# Patient Record
Sex: Female | Born: 1960 | Hispanic: Yes | Smoking: Never smoker
Health system: Southern US, Community
[De-identification: ages and names within clinical notes are randomized; demographics above are authoritative.]

## PROBLEM LIST (undated history)

## (undated) DIAGNOSIS — M8580 Other specified disorders of bone density and structure, unspecified site: Secondary | ICD-10-CM

## (undated) DIAGNOSIS — K219 Gastro-esophageal reflux disease without esophagitis: Secondary | ICD-10-CM

## (undated) HISTORY — PX: MYOMECTOMY: SHX85

## (undated) HISTORY — DX: Other specified disorders of bone density and structure, unspecified site: M85.80

## (undated) HISTORY — DX: Gastro-esophageal reflux disease without esophagitis: K21.9

---

## 2005-10-22 HISTORY — PX: MYOMECTOMY: SHX85

## 2018-08-17 DIAGNOSIS — K047 Periapical abscess without sinus: Secondary | ICD-10-CM | POA: Diagnosis not present

## 2018-08-17 DIAGNOSIS — R6884 Jaw pain: Secondary | ICD-10-CM | POA: Diagnosis not present

## 2018-10-06 ENCOUNTER — Ambulatory Visit (INDEPENDENT_AMBULATORY_CARE_PROVIDER_SITE_OTHER): Payer: BLUE CROSS/BLUE SHIELD | Admitting: Family Medicine

## 2018-10-06 ENCOUNTER — Encounter: Payer: Self-pay | Admitting: Family Medicine

## 2018-10-06 VITALS — BP 134/82 | HR 82 | Temp 98.6°F | Ht 61.0 in | Wt 135.6 lb

## 2018-10-06 DIAGNOSIS — Z1159 Encounter for screening for other viral diseases: Secondary | ICD-10-CM | POA: Diagnosis not present

## 2018-10-06 DIAGNOSIS — M25561 Pain in right knee: Secondary | ICD-10-CM

## 2018-10-06 DIAGNOSIS — M722 Plantar fascial fibromatosis: Secondary | ICD-10-CM

## 2018-10-06 DIAGNOSIS — R030 Elevated blood-pressure reading, without diagnosis of hypertension: Secondary | ICD-10-CM

## 2018-10-06 DIAGNOSIS — R140 Abdominal distension (gaseous): Secondary | ICD-10-CM | POA: Diagnosis not present

## 2018-10-06 DIAGNOSIS — Z114 Encounter for screening for human immunodeficiency virus [HIV]: Secondary | ICD-10-CM | POA: Diagnosis not present

## 2018-10-06 DIAGNOSIS — Z23 Encounter for immunization: Secondary | ICD-10-CM

## 2018-10-06 DIAGNOSIS — M25562 Pain in left knee: Secondary | ICD-10-CM

## 2018-10-06 DIAGNOSIS — Z131 Encounter for screening for diabetes mellitus: Secondary | ICD-10-CM | POA: Diagnosis not present

## 2018-10-06 DIAGNOSIS — Z13220 Encounter for screening for lipoid disorders: Secondary | ICD-10-CM | POA: Diagnosis not present

## 2018-10-06 DIAGNOSIS — L720 Epidermal cyst: Secondary | ICD-10-CM

## 2018-10-06 DIAGNOSIS — G8929 Other chronic pain: Secondary | ICD-10-CM | POA: Diagnosis not present

## 2018-10-06 LAB — CBC
HCT: 41 % (ref 36.0–46.0)
Hemoglobin: 14 g/dL (ref 12.0–15.0)
MCHC: 34.1 g/dL (ref 30.0–36.0)
MCV: 92.9 fl (ref 78.0–100.0)
Platelets: 315 10*3/uL (ref 150.0–400.0)
RBC: 4.41 Mil/uL (ref 3.87–5.11)
RDW: 13.5 % (ref 11.5–15.5)
WBC: 5.2 10*3/uL (ref 4.0–10.5)

## 2018-10-06 LAB — COMPREHENSIVE METABOLIC PANEL
ALT: 33 U/L (ref 0–35)
AST: 21 U/L (ref 0–37)
Albumin: 4.3 g/dL (ref 3.5–5.2)
Alkaline Phosphatase: 63 U/L (ref 39–117)
BUN: 13 mg/dL (ref 6–23)
CHLORIDE: 103 meq/L (ref 96–112)
CO2: 29 mEq/L (ref 19–32)
CREATININE: 0.64 mg/dL (ref 0.40–1.20)
Calcium: 9.7 mg/dL (ref 8.4–10.5)
GFR: 101.64 mL/min (ref >60.00)
Glucose, Bld: 99 mg/dL (ref 70–99)
Potassium: 3.8 mEq/L (ref 3.5–5.1)
Sodium: 140 mEq/L (ref 135–145)
Total Bilirubin: 0.7 mg/dL (ref 0.2–1.2)
Total Protein: 7.3 g/dL (ref 6.0–8.3)

## 2018-10-06 LAB — LIPID PANEL
Cholesterol: 249 mg/dL — ABNORMAL HIGH (ref 0–200)
HDL: 57.7 mg/dL (ref >39.00)
LDL Cholesterol: 152 mg/dL — ABNORMAL HIGH (ref 0–99)
NonHDL: 191.1
Total CHOL/HDL Ratio: 4
Triglycerides: 194 mg/dL — ABNORMAL HIGH (ref 0.0–149.0)
VLDL: 38.8 mg/dL (ref 0.0–40.0)

## 2018-10-06 LAB — TSH: TSH: 3.52 u[IU]/mL (ref 0.35–4.50)

## 2018-10-06 LAB — HEMOGLOBIN A1C: Hgb A1c MFr Bld: 5.8 % (ref 4.6–6.5)

## 2018-10-06 MED ORDER — SIMETHICONE 80 MG PO CHEW: 80.0000 mg | CHEWABLE_TABLET | Freq: Four times a day (QID) | ORAL | 0 refills | Status: DC | PRN

## 2018-10-06 NOTE — Assessment & Plan Note (Signed)
No red flags.  Benign abdominal exam.  I am not sure what medication she was taking while improved.  Symptoms seem to be most related to gas/bloating.  We will start trial of simethicone.

## 2018-10-06 NOTE — Progress Notes (Signed)
Subjective:  Brandy [name] is a 57 y.o. female who presents today with a chief complaint of elevated blood pressure reading and to establish care.   HPI:  Elevated blood pressure reading Patient has never had elevated blood pressure readings in the past.  She recently had a dental procedure done and was told that she had high blood pressure readings.  She does not remember the the numbers.  She has never been on medications in the past.  Recently moved to [place] and has been under some stress recently.  No chest pain or shortness of breath.  Bilateral knee pain Chronic problem.  New to provider.  Several year history.  Symptoms are stable.  Pain is worse with activity and exercise.  She reportedly had injections done in the past which did not significantly help.  Pain is currently manageable.  She does not take any medication regularly for this.  No history of trauma or other obvious precipitating events.  No locking, catching, or buckling.  Skin lesions Chronic problem.  New to provider.  Several year history.  Patient has several scattered, cystic lesions across her face.  She previously saw a dermatologist while in [place] and had a procedure done to remove the lesions.  Her symptoms are stable.  She would like to be referred back to dermatology to have this evaluated.  Abdominal bloating Chronic problem.  New to provider.  Several year history.  Patient was previously on a medication twice daily to help with her symptoms.  She does not member the name of this medication.  Since moving to the [place] about a month ago, she has not been on any medication and she has had a worsening of her symptoms.  She denies any pain.  Typically has bloating after eating.  She does not have any other clear or obvious precipitating or aggravating factors.  No nausea or vomiting.  No constipation or diarrhea.  No melena.  No hematochezia.  Foot pain Chronic problem.  New to provider.   Worsened recently.  Located in the bottom of both of her feet.  Worse with first step in the morning.  No treatments tried for this.  ROS: Per HPI, otherwise a complete review of systems was negative.   PMH:  The following were reviewed and entered/updated in epic: History reviewed. No pertinent past medical history. Patient Active Problem List   Diagnosis Date Noted  . Epidermal inclusion cyst 10/06/2018  . Elevated blood pressure reading 10/06/2018  . Chronic pain of both knees 10/06/2018  . Plantar fasciitis 10/06/2018  . Abdominal bloating 10/06/2018   Past Surgical History:  Procedure Laterality Date  . MYOMECTOMY     2007    History reviewed. No pertinent family history.  Medications- reviewed and updated Current Outpatient Medications  Medication Sig Dispense Refill  . Omega-3 Fatty Acids (FISH OIL) 1000 MG CAPS Take by mouth.    Marland Kitchen. VITAMIN D, CHOLECALCIFEROL, PO Take by mouth.    . simethicone (MYLICON) 80 MG chewable tablet Chew 1 tablet (80 mg total) by mouth every 6 (six) hours as needed for flatulence. 30 tablet 0   No current facility-administered medications for this visit.     Allergies-reviewed and updated No Known Allergies  Social History   Socioeconomic History  . Marital status: Not on file    Spouse name: Not on file  . Number of children: Not on file  . Years of education: Not on file  . Highest education  level: Not on file  Occupational History  . Not on file  Social Needs  . Financial resource strain: Not on file  . Food insecurity:    Worry: Not on file    Inability: Not on file  . Transportation needs:    Medical: Not on file    Non-medical: Not on file  Tobacco Use  . Smoking status: Never Smoker  . Smokeless tobacco: Never Used  Substance and Sexual Activity  . Alcohol use: Not Currently  . Drug use: Never  . Sexual activity: Not on file  Lifestyle  . Physical activity:    Days per week: Not on file    Minutes per session:  Not on file  . Stress: Not on file  Relationships  . Social connections:    Talks on phone: Not on file    Gets together: Not on file    Attends religious service: Not on file    Active member of club or organization: Not on file    Attends meetings of clubs or organizations: Not on file    Relationship status: Not on file  Other Topics Concern  . Not on file  Social History Narrative  . Not on file     Objective:  Physical Exam: BP 134/82 (BP Location: Left Arm, Patient Position: Sitting, Cuff Size: Normal)   Pulse 82   Temp 98.6 F (37 C) (Oral)   Ht 5\' 1"  (1.549 m)   Wt 135 lb 9.6 oz (61.5 kg)   SpO2 97%   BMI 25.62 kg/m   Gen: NAD, resting comfortably CV: RRR with no murmurs appreciated Pulm: NWOB, CTAB with no crackles, wheezes, or rhonchi GI: Normal bowel sounds present. Soft, Nontender, Nondistended. MSK:  -Knees: No deformities.  Crepitus with passive range of motion bilaterally.  No joint tenderness.  Stable to varus and valgus stress.  Anterior and posterior drawer signs negative.  McMurray negative bilaterally.  Neurovascular intact distally. Skin: Several small, discrete 1 to 2 mm in diameter cystic lesions scattered across face. Neuro: Grossly normal, moves all extremities Psych: Normal affect and thought content  Assessment/Plan:  Plantar fasciitis No red flags.  Recommended stretching before getting out of bed in the morning.  Also recommended use of frozen ice bottle and/or tennis ball.  Epidermal inclusion cyst Referral placed to dermatology.  Elevated blood pressure reading At goal.  Discussed home blood pressure monitoring with goal 140/90 or lower.  We will continue with watchful waiting.  Chronic pain of both knees Likely secondary to osteoarthritis.  No red flags.  Discussed conservative management including ice, compression, and Tylenol as needed.  Discussed reasons to return to care.  Abdominal bloating No red flags.  Benign abdominal exam.   I am not sure what medication she was taking while improved.  Symptoms seem to be most related to gas/bloating.  We will start trial of simethicone.  Preventative Healthcare Patient was instructed to return soon for CPE.  Flu shot given today.  Tdap given today.  Check lipid panel and A1c.  Will be due for screening mammogram, colonoscopy, and Pap smear soon-will attempt again prior records. Health Maintenance Due  Topic Date Due  . Hepatitis C Screening  August 06, 1961  . HIV Screening  09/14/1976  . TETANUS/TDAP  09/14/1980  . PAP SMEAR-Modifier  09/14/1982  . MAMMOGRAM  09/15/2011  . COLONOSCOPY  09/15/2011  . INFLUENZA VACCINE  05/22/2018   Katina Degree. [name], MD 10/06/2018 12:31 PM

## 2018-10-06 NOTE — Assessment & Plan Note (Signed)
No red flags.  Recommended stretching before getting out of bed in the morning.  Also recommended use of frozen ice bottle and/or tennis ball.

## 2018-10-06 NOTE — Addendum Note (Signed)
Addended by: Koleen DistanceAGNER, [place] Coffin M on: 10/06/2018 03:11 PM   Modules accepted: Orders

## 2018-10-06 NOTE — Assessment & Plan Note (Signed)
At goal.  Discussed home blood pressure monitoring with goal 140/90 or lower.  We will continue with watchful waiting.

## 2018-10-06 NOTE — Assessment & Plan Note (Signed)
Referral placed to dermatology

## 2018-10-06 NOTE — Patient Instructions (Addendum)
It was very nice to see you today!  We will send you to a dermatologist for the spots on your face.  Please try the simethicone for bloating.  Let me know if it is not working you can try another medication.  You have arthritis in your knees.  Please use ice to the area a few times a day.  You can also use compression sleeves to the area.  You can take Tylenol 1000 mg 3 times daily as needed as well.  You can also try using glucosamine-chondroitin.   You also have plantar fasciitis.  Please try stretching this area every day.  You can also use a frozen water bottle or tennis ball to the area.  We will give your flu vaccine today.  We will also give your tetanus shot.  We will check blood work today.  Take care, Dr Jimmey RalphParker

## 2018-10-06 NOTE — Assessment & Plan Note (Signed)
Likely secondary to osteoarthritis.  No red flags.  Discussed conservative management including ice, compression, and Tylenol as needed.  Discussed reasons to return to care.

## 2018-10-07 LAB — HEPATITIS C ANTIBODY
Hepatitis C Ab: NONREACTIVE
SIGNAL TO CUT-OFF: 0.11 (ref <1.00)

## 2018-10-07 LAB — HIV ANTIBODY (ROUTINE TESTING W REFLEX): HIV 1&2 Ab, 4th Generation: NONREACTIVE

## 2018-10-08 ENCOUNTER — Encounter: Payer: Self-pay | Admitting: Family Medicine

## 2018-10-08 DIAGNOSIS — E785 Hyperlipidemia, unspecified: Secondary | ICD-10-CM | POA: Insufficient documentation

## 2018-10-08 NOTE — Progress Notes (Signed)
Please inform patient of the following:  Her cholesterol and blood sugar levels are borderline. Do not need to start medications at this point, but would recommend continuing to work on diet and exercise and we can recheck again in 1 year.  All of her other blood work was normal.  Katina Degreealeb M. Jimmey RalphParker, MD 10/08/2018 7:53 AM

## 2018-11-19 DIAGNOSIS — L821 Other seborrheic keratosis: Secondary | ICD-10-CM | POA: Diagnosis not present

## 2018-11-19 DIAGNOSIS — L720 Epidermal cyst: Secondary | ICD-10-CM | POA: Diagnosis not present

## 2018-11-19 DIAGNOSIS — I781 Nevus, non-neoplastic: Secondary | ICD-10-CM | POA: Diagnosis not present

## 2018-11-19 DIAGNOSIS — L814 Other melanin hyperpigmentation: Secondary | ICD-10-CM | POA: Diagnosis not present

## 2019-07-13 ENCOUNTER — Ambulatory Visit (INDEPENDENT_AMBULATORY_CARE_PROVIDER_SITE_OTHER): Payer: BC Managed Care – PPO | Admitting: Family Medicine

## 2019-07-13 ENCOUNTER — Other Ambulatory Visit: Payer: Self-pay

## 2019-07-13 ENCOUNTER — Other Ambulatory Visit (HOSPITAL_COMMUNITY)
Admission: RE | Admit: 2019-07-13 | Discharge: 2019-07-13 | Disposition: A | Discharge status: Home / Self Care | Payer: BC Managed Care – PPO | Source: Ambulatory Visit | Attending: Family Medicine | Admitting: Family Medicine

## 2019-07-13 ENCOUNTER — Encounter: Payer: Self-pay | Admitting: Family Medicine

## 2019-07-13 VITALS — BP 116/64 | HR 63 | Temp 98.7°F | Ht 61.0 in | Wt 132.2 lb

## 2019-07-13 DIAGNOSIS — E785 Hyperlipidemia, unspecified: Secondary | ICD-10-CM | POA: Diagnosis not present

## 2019-07-13 DIAGNOSIS — R739 Hyperglycemia, unspecified: Secondary | ICD-10-CM | POA: Diagnosis not present

## 2019-07-13 DIAGNOSIS — Z124 Encounter for screening for malignant neoplasm of cervix: Secondary | ICD-10-CM | POA: Insufficient documentation

## 2019-07-13 DIAGNOSIS — Z23 Encounter for immunization: Secondary | ICD-10-CM | POA: Diagnosis not present

## 2019-07-13 DIAGNOSIS — Z1211 Encounter for screening for malignant neoplasm of colon: Secondary | ICD-10-CM

## 2019-07-13 DIAGNOSIS — E559 Vitamin D deficiency, unspecified: Secondary | ICD-10-CM | POA: Diagnosis not present

## 2019-07-13 DIAGNOSIS — Z0001 Encounter for general adult medical examination with abnormal findings: Secondary | ICD-10-CM

## 2019-07-13 DIAGNOSIS — M25521 Pain in right elbow: Secondary | ICD-10-CM

## 2019-07-13 LAB — COMPREHENSIVE METABOLIC PANEL
ALT: 18 U/L (ref 0–35)
AST: 18 U/L (ref 0–37)
Albumin: 4.3 g/dL (ref 3.5–5.2)
Alkaline Phosphatase: 74 U/L (ref 39–117)
BUN: 12 mg/dL (ref 6–23)
CO2: 28 mEq/L (ref 19–32)
Calcium: 9.5 mg/dL (ref 8.4–10.5)
Chloride: 103 mEq/L (ref 96–112)
Creatinine, Ser: 0.69 mg/dL (ref 0.40–1.20)
GFR: 87.44 mL/min (ref >60.00)
Glucose, Bld: 104 mg/dL — ABNORMAL HIGH (ref 70–99)
Potassium: 3.9 mEq/L (ref 3.5–5.1)
Sodium: 139 mEq/L (ref 135–145)
Total Bilirubin: 0.6 mg/dL (ref 0.2–1.2)
Total Protein: 7.3 g/dL (ref 6.0–8.3)

## 2019-07-13 LAB — LIPID PANEL
Cholesterol: 224 mg/dL — ABNORMAL HIGH (ref 0–200)
HDL: 51.4 mg/dL (ref >39.00)
LDL Cholesterol: 133 mg/dL — ABNORMAL HIGH (ref 0–99)
NonHDL: 172.96
Total CHOL/HDL Ratio: 4
Triglycerides: 199 mg/dL — ABNORMAL HIGH (ref 0.0–149.0)
VLDL: 39.8 mg/dL (ref 0.0–40.0)

## 2019-07-13 LAB — CBC
HCT: 43.5 % (ref 36.0–46.0)
Hemoglobin: 14.4 g/dL (ref 12.0–15.0)
MCHC: 33.2 g/dL (ref 30.0–36.0)
MCV: 94.1 fl (ref 78.0–100.0)
Platelets: 271 10*3/uL (ref 150.0–400.0)
RBC: 4.62 Mil/uL (ref 3.87–5.11)
RDW: 13.7 % (ref 11.5–15.5)
WBC: 4.5 10*3/uL (ref 4.0–10.5)

## 2019-07-13 LAB — HEMOGLOBIN A1C: Hgb A1c MFr Bld: 5.9 % (ref 4.6–6.5)

## 2019-07-13 LAB — VITAMIN D 25 HYDROXY (VIT D DEFICIENCY, FRACTURES): VITD: 21.68 ng/mL — ABNORMAL LOW (ref 30.00–100.00)

## 2019-07-13 LAB — VITAMIN B12: Vitamin B-12: 213 pg/mL (ref 211–911)

## 2019-07-13 LAB — TSH: TSH: 4.48 u[IU]/mL (ref 0.35–4.50)

## 2019-07-13 NOTE — Assessment & Plan Note (Signed)
Check A1c. 

## 2019-07-13 NOTE — Assessment & Plan Note (Signed)
Check vitamin D level 

## 2019-07-13 NOTE — Progress Notes (Signed)
Chief Complaint:  Brandy [name] is a 58 y.o. female who presents today for her annual comprehensive physical exam.    Assessment/Plan:  Hyperglycemia Check A1c.  Vitamin D deficiency Check vitamin D level.  Dyslipidemia Check CBC, C met, TSH, and lipid panel.  Right Elbow Pain Mild right lateral epicondylitis.  Recommended Voltaren. Discussed reasons to return to care.   Preventative Healthcare: Flu vaccine today. Order for cologuard placed. Pap smear performed today. Check CBC, CMET, TSH, A1c, and lipid panel.   Patient Counseling(The following topics were reviewed and/or handout was given):  -Nutrition: Stressed importance of moderation in sodium/caffeine intake, saturated fat and cholesterol, caloric balance, sufficient intake of fresh fruits, vegetables, and fiber.  -Stressed the importance of regular exercise.   -Substance Abuse: Discussed cessation/primary prevention of tobacco, alcohol, or other drug use; driving or other dangerous activities under the influence; availability of treatment for abuse.   -Injury prevention: Discussed safety belts, safety helmets, smoke detector, smoking near bedding or upholstery.   -Sexuality: Discussed sexually transmitted diseases, partner selection, use of condoms, avoidance of unintended pregnancy and contraceptive alternatives.   -Dental health: Discussed importance of regular tooth brushing, flossing, and dental visits.  -Health maintenance and immunizations reviewed. Please refer to Health maintenance section.  Return to care in 1 year for next preventative visit.     Subjective:  HPI:  She has no acute complaints today.   She has had a mild recurrence of abdominal bloating over the last couple days. She thinks this is diet related.   She has also had some right elbow pain when lifting weights. Pain is very minor and subsides with rest.   Lifestyle Diet: Cut out carbohydrates. Working on [employer].  Exercise:  Contra twice per week. Works on weight straining.   Depression screen [facility] 2/9 07/13/2019  Decreased Interest 0  Down, Depressed, Hopeless 0  PHQ - 2 Score 0  Altered sleeping 0  Tired, decreased energy 0  Change in appetite 0  Feeling bad or failure about yourself  0  Trouble concentrating 0  Moving slowly or fidgety/restless 0  Suicidal thoughts 0  PHQ-9 Score 0  Difficult doing work/chores Not difficult at all    Health Maintenance Due  Topic Date Due  . PAP SMEAR-Modifier  09/14/1982  . INFLUENZA VACCINE  05/23/2019     ROS: Per HPI, otherwise a complete review of systems was negative.   PMH:  The following were reviewed and entered/updated in epic: History reviewed. No pertinent past medical history. Patient Active Problem List   Diagnosis Date Noted  . Vitamin D deficiency 07/13/2019  . Hyperglycemia 07/13/2019  . Dyslipidemia 10/08/2018  . Epidermal inclusion cyst 10/06/2018  . Elevated blood pressure reading 10/06/2018  . Chronic pain of both knees 10/06/2018  . Plantar fasciitis 10/06/2018  . Abdominal bloating 10/06/2018   Past Surgical History:  Procedure Laterality Date  . MYOMECTOMY     2007    History reviewed. No pertinent family history.  Medications- reviewed and updated Current Outpatient Medications  Medication Sig Dispense Refill  . BIOTIN PO Take by mouth.    . Zinc 50 MG CAPS Take by mouth.     No current facility-administered medications for this visit.     Allergies-reviewed and updated No Known Allergies  Social History   Socioeconomic History  . Marital status: Married    Spouse name: Not on file  . Number of children: Not on file  . Years of education: Not  on file  . Highest education level: Not on file  Occupational History  . Not on file  Social Needs  . Financial resource strain: Not on file  . Food insecurity    Worry: Not on file    Inability: Not on file  . Transportation needs    Medical: Not on file     Non-medical: Not on file  Tobacco Use  . Smoking status: Never Smoker  . Smokeless tobacco: Never Used  Substance and Sexual Activity  . Alcohol use: Not Currently  . Drug use: Never  . Sexual activity: Not on file  Lifestyle  . Physical activity    Days per week: Not on file    Minutes per session: Not on file  . Stress: Not on file  Relationships  . Social Herbalist on phone: Not on file    Gets together: Not on file    Attends religious service: Not on file    Active member of club or organization: Not on file    Attends meetings of clubs or organizations: Not on file    Relationship status: Not on file  Other Topics Concern  . Not on file  Social History Narrative  . Not on file        Objective:  Physical Exam: BP 116/64   Pulse 63   Temp 98.7 F (37.1 C)   Ht _0  (1.549 m)   Wt 132 lb 4 oz (60 kg)   SpO2 99%   BMI 24.99 kg/m   Body mass index is 24.99 kg/m. Wt Readings from Last 3 Encounters:  07/13/19 132 lb 4 oz (60 kg)  10/06/18 135 lb 9.6 oz (61.5 kg)   Gen: NAD, resting comfortably HEENT: TMs normal bilaterally. OP clear. No thyromegaly noted.  CV: RRR with no murmurs appreciated Pulm: NWOB, CTAB with no crackles, wheezes, or rhonchi GI: Normal bowel sounds present. Soft, Nontender, Nondistended. GU: Normal external and internal female genitalia.  MSK: no edema, cyanosis, or clubbing noted. TTP along right lateral epicondyle.  Skin: warm, dry Neuro: CN2-12 grossly intact. Strength 5/5 in upper and lower extremities. Reflexes symmetric and intact bilaterally.  Psych: Normal affect and thought content     Maurie Musco M. Jerline Pain, MD 07/13/2019 10:30 AM

## 2019-07-13 NOTE — Patient Instructions (Signed)
It was very nice to see you today!  Please try using Voltaren gel.  Let me know if the bloating does not improve.  We will check blood work today.  We will give you a flu vaccine today.  I will order Cologuard.  This will screen for colon cancer.  We did your Pap smear today.  If this is normal you will need a repeat test in 5 years.  Come back to see me in 1 year for your next physical, or sooner if needed  Take care, Dr Jerline Pain  Please try these tips to maintain a healthy lifestyle:   Eat at least 3 REAL meals and 1-2 snacks per day.  Aim for no more than 5 hours between eating.  If you eat breakfast, please do so within one hour of getting up.    Obtain twice as many fruits/vegetables as protein or carbohydrate foods for both lunch and dinner. (Half of each meal should be fruits/vegetables, one quarter protein, and one quarter starchy carbs)   Cut down on sweet beverages. This includes juice, soda, and sweet tea.    Exercise at least 150 minutes every week.    Preventive Care 14-43 Years Old, Female Preventive care refers to visits with your health care provider and lifestyle choices that can promote health and wellness. This includes:  A yearly physical exam. This may also be called an annual well check.  Regular dental visits and eye exams.  Immunizations.  Screening for certain conditions.  Healthy lifestyle choices, such as eating a healthy diet, getting regular exercise, not using drugs or products that contain nicotine and tobacco, and limiting alcohol use. What can I expect for my preventive care visit? Physical exam Your health care provider will check your:  Height and weight. This may be used to calculate body mass index (BMI), which tells if you are at a healthy weight.  Heart rate and blood pressure.  Skin for abnormal spots. Counseling Your health care provider may ask you questions about your:  Alcohol, tobacco, and drug use.  Emotional  well-being.  Home and relationship well-being.  Sexual activity.  Eating habits.  Work and work Statistician.  Method of birth control.  Menstrual cycle.  Pregnancy history. What immunizations do I need?  Influenza (flu) vaccine  This is recommended every year. Tetanus, diphtheria, and pertussis (Tdap) vaccine  You may need a Td booster every 10 years. Varicella (chickenpox) vaccine  You may need this if you have not been vaccinated. Zoster (shingles) vaccine  You may need this after age 95. Measles, mumps, and rubella (MMR) vaccine  You may need at least one dose of MMR if you were born in 1957 or later. You may also need a second dose. Pneumococcal conjugate (PCV13) vaccine  You may need this if you have certain conditions and were not previously vaccinated. Pneumococcal polysaccharide (PPSV23) vaccine  You may need one or two doses if you smoke cigarettes or if you have certain conditions. Meningococcal conjugate (MenACWY) vaccine  You may need this if you have certain conditions. Hepatitis A vaccine  You may need this if you have certain conditions or if you travel or work in places where you may be exposed to hepatitis A. Hepatitis B vaccine  You may need this if you have certain conditions or if you travel or work in places where you may be exposed to hepatitis B. Haemophilus influenzae type b (Hib) vaccine  You may need this if you have certain conditions.  Human papillomavirus (HPV) vaccine  If recommended by your health care provider, you may need three doses over 6 months. You may receive vaccines as individual doses or as more than one vaccine together in one shot (combination vaccines). Talk with your health care provider about the risks and benefits of combination vaccines. What tests do I need? Blood tests  Lipid and cholesterol levels. These may be checked every 5 years, or more frequently if you are over 59 years old.  Hepatitis C test.   Hepatitis B test. Screening  Lung cancer screening. You may have this screening every year starting at age 9 if you have a 30-pack-year history of smoking and currently smoke or have quit within the past 15 years.  Colorectal cancer screening. All adults should have this screening starting at age 88 and continuing until age 54. Your health care provider may recommend screening at age 41 if you are at increased risk. You will have tests every 1-10 years, depending on your results and the type of screening test.  Diabetes screening. This is done by checking your blood sugar (glucose) after you have not eaten for a while (fasting). You may have this done every 1-3 years.  Mammogram. This may be done every 1-2 years. Talk with your health care provider about when you should start having regular mammograms. This may depend on whether you have a family history of breast cancer.  BRCA-related cancer screening. This may be done if you have a family history of breast, ovarian, tubal, or peritoneal cancers.  Pelvic exam and Pap test. This may be done every 3 years starting at age 37. Starting at age 67, this may be done every 5 years if you have a Pap test in combination with an HPV test. Other tests  Sexually transmitted disease (STD) testing.  Bone density scan. This is done to screen for osteoporosis. You may have this scan if you are at high risk for osteoporosis. Follow these instructions at home: Eating and drinking  Eat a diet that includes fresh fruits and vegetables, whole grains, lean protein, and low-fat dairy.  Take vitamin and mineral supplements as recommended by your health care provider.  Do not drink alcohol if: ? Your health care provider tells you not to drink. ? You are pregnant, may be pregnant, or are planning to become pregnant.  If you drink alcohol: ? Limit how much you have to 0-1 drink a day. ? Be aware of how much alcohol is in your drink. In the [place] one drink  equals one 12 oz bottle of beer (355 mL), one 5 oz glass of wine (148 mL), or one 1 oz glass of hard liquor (44 mL). Lifestyle  Take daily care of your teeth and gums.  Stay active. Exercise for at least 30 minutes on 5 or more days each week.  Do not use any products that contain nicotine or tobacco, such as cigarettes, e-cigarettes, and chewing tobacco. If you need help quitting, ask your health care provider.  If you are sexually active, practice safe sex. Use a condom or other form of birth control (contraception) in order to prevent pregnancy and STIs (sexually transmitted infections).  If told by your health care provider, take low-dose aspirin daily starting at age 69. What's next?  Visit your health care provider once a year for a well check visit.  Ask your health care provider how often you should have your eyes and teeth checked.  Stay up to date on  all vaccines. This information is not intended to replace advice given to you by your health care provider. Make sure you discuss any questions you have with your health care provider. Document Released: 11/04/2015 Document Revised: 06/19/2018 Document Reviewed: 06/19/2018 Elsevier Patient Education  2020 [employer].

## 2019-07-13 NOTE — Addendum Note (Signed)
Addended by: Loralyn Freshwater on: 07/13/2019 10:37 AM   Modules accepted: Orders

## 2019-07-13 NOTE — Assessment & Plan Note (Signed)
Check CBC, C met, TSH, and lipid panel. 

## 2019-07-15 LAB — CYTOLOGY - PAP
Diagnosis: NEGATIVE
High risk HPV: NEGATIVE
Molecular Disclaimer: 56
Molecular Disclaimer: DETECTED
Molecular Disclaimer: NORMAL

## 2019-07-16 NOTE — Progress Notes (Signed)
Please inform patient of the following:  Her blood sugar and cholesterol are both borderline but better compared to last year - Would like for her to keep up the good work and we can recheck in a year. Her vitamin D is low. Recommend starting 50000IU weekly and we can recheck in 3-6 months. Please send in for patient.  The rest of her blood work was NORMAL. Her pap smear was NORMAL. We can repeat her pap smear in 5 years.  Brandy [name]. Jerline Pain, MD 07/16/2019 12:05 PM

## 2019-07-17 ENCOUNTER — Other Ambulatory Visit: Payer: Self-pay

## 2019-07-17 MED ORDER — CHOLECALCIFEROL 1.25 MG (50000 UT) PO TABS: ORAL_TABLET | ORAL | 1 refills | Status: DC

## 2019-07-17 NOTE — Progress Notes (Signed)
vit

## 2019-07-24 DIAGNOSIS — Z1211 Encounter for screening for malignant neoplasm of colon: Secondary | ICD-10-CM | POA: Diagnosis not present

## 2019-07-30 LAB — COLOGUARD: Cologuard: NEGATIVE

## 2019-07-31 ENCOUNTER — Other Ambulatory Visit: Payer: Self-pay

## 2019-07-31 ENCOUNTER — Other Ambulatory Visit: Payer: Self-pay | Admitting: Family Medicine

## 2019-07-31 DIAGNOSIS — Z1231 Encounter for screening mammogram for malignant neoplasm of breast: Secondary | ICD-10-CM

## 2019-07-31 DIAGNOSIS — E2839 Other primary ovarian failure: Secondary | ICD-10-CM

## 2019-07-31 DIAGNOSIS — E559 Vitamin D deficiency, unspecified: Secondary | ICD-10-CM

## 2019-08-05 ENCOUNTER — Encounter: Payer: Self-pay | Admitting: Family Medicine

## 2019-08-24 ENCOUNTER — Encounter: Payer: Self-pay | Admitting: Family Medicine

## 2019-08-25 NOTE — Telephone Encounter (Signed)
Notified ok to get tested at [facility] location

## 2019-09-02 ENCOUNTER — Ambulatory Visit
Admission: RE | Admit: 2019-09-02 | Discharge: 2019-09-02 | Disposition: A | Discharge status: Home / Self Care | Payer: BC Managed Care – PPO | Source: Ambulatory Visit | Attending: Family Medicine | Admitting: Family Medicine

## 2019-09-02 ENCOUNTER — Other Ambulatory Visit: Payer: Self-pay

## 2019-09-02 DIAGNOSIS — Z780 Asymptomatic menopausal state: Secondary | ICD-10-CM | POA: Diagnosis not present

## 2019-09-02 DIAGNOSIS — M8588 Other specified disorders of bone density and structure, other site: Secondary | ICD-10-CM | POA: Diagnosis not present

## 2019-09-02 DIAGNOSIS — E2839 Other primary ovarian failure: Secondary | ICD-10-CM

## 2019-09-02 DIAGNOSIS — E559 Vitamin D deficiency, unspecified: Secondary | ICD-10-CM

## 2019-09-02 NOTE — Progress Notes (Signed)
Please inform patient of the following:  Her bone desnity scan showed osteopenia. We do not need to start any medications but I would like for her to make sure she is getting at least 1200mg  of calcium and 800IU of vitamin D daily.  We can recheck in 2 years.  Algis Greenhouse. Jerline Pain, MD 09/02/2019 11:06 AM

## 2019-09-03 ENCOUNTER — Ambulatory Visit
Admission: RE | Admit: 2019-09-03 | Discharge: 2019-09-03 | Disposition: A | Discharge status: Home / Self Care | Payer: BC Managed Care – PPO | Source: Ambulatory Visit | Attending: Family Medicine | Admitting: Family Medicine

## 2019-09-03 ENCOUNTER — Ambulatory Visit: Payer: BC Managed Care – PPO

## 2019-09-03 DIAGNOSIS — Z1231 Encounter for screening mammogram for malignant neoplasm of breast: Secondary | ICD-10-CM

## 2019-10-03 DIAGNOSIS — Z20828 Contact with and (suspected) exposure to other viral communicable diseases: Secondary | ICD-10-CM | POA: Diagnosis not present

## 2019-10-05 ENCOUNTER — Encounter: Payer: Self-pay | Admitting: Family Medicine

## 2019-10-06 ENCOUNTER — Encounter: Payer: Self-pay | Admitting: Family Medicine

## 2019-10-06 DIAGNOSIS — Z20828 Contact with and (suspected) exposure to other viral communicable diseases: Secondary | ICD-10-CM | POA: Diagnosis not present

## 2020-07-15 ENCOUNTER — Encounter: Payer: BC Managed Care – PPO | Admitting: Family Medicine

## 2020-07-21 ENCOUNTER — Encounter: Payer: BC Managed Care – PPO | Admitting: Family Medicine

## 2020-08-01 ENCOUNTER — Ambulatory Visit (INDEPENDENT_AMBULATORY_CARE_PROVIDER_SITE_OTHER): Payer: BC Managed Care – PPO | Admitting: Family Medicine

## 2020-08-01 ENCOUNTER — Encounter: Payer: Self-pay | Admitting: Family Medicine

## 2020-08-01 ENCOUNTER — Other Ambulatory Visit: Payer: Self-pay

## 2020-08-01 VITALS — BP 145/81 | HR 79 | Temp 98.1°F | Ht 61.0 in | Wt 141.6 lb

## 2020-08-01 DIAGNOSIS — R6884 Jaw pain: Secondary | ICD-10-CM

## 2020-08-01 DIAGNOSIS — E785 Hyperlipidemia, unspecified: Secondary | ICD-10-CM | POA: Diagnosis not present

## 2020-08-01 DIAGNOSIS — Z23 Encounter for immunization: Secondary | ICD-10-CM

## 2020-08-01 DIAGNOSIS — R739 Hyperglycemia, unspecified: Secondary | ICD-10-CM

## 2020-08-01 DIAGNOSIS — M1990 Unspecified osteoarthritis, unspecified site: Secondary | ICD-10-CM

## 2020-08-01 DIAGNOSIS — E538 Deficiency of other specified B group vitamins: Secondary | ICD-10-CM

## 2020-08-01 DIAGNOSIS — Z0001 Encounter for general adult medical examination with abnormal findings: Secondary | ICD-10-CM

## 2020-08-01 DIAGNOSIS — E559 Vitamin D deficiency, unspecified: Secondary | ICD-10-CM | POA: Diagnosis not present

## 2020-08-01 MED ORDER — MELOXICAM 15 MG PO TABS: 15.0000 mg | ORAL_TABLET | Freq: Every day | ORAL | 0 refills | Status: DC

## 2020-08-01 NOTE — Assessment & Plan Note (Signed)
Check vitamin D.  Continue 1000 international units daily.

## 2020-08-01 NOTE — Patient Instructions (Signed)
It was very nice to see you today!  I think you have inflammation in your jaw.  Please start meloxicam and let me know if not improving in 1 to 2 weeks.  You can try using Voltaren for your elbows and hands.  We will check blood work today we will give you a flu vaccine today.  I will see you back in a year for your next checkup.  Please come back to see me sooner if needed.  Take care, Dr Jerline Pain  Please try these tips to maintain a healthy lifestyle:   Eat at least 3 REAL meals and 1-2 snacks per day.  Aim for no more than 5 hours between eating.  If you eat breakfast, please do so within one hour of getting up.    Each meal should contain half fruits/vegetables, one quarter protein, and one quarter carbs (no bigger than a computer mouse)   Cut down on sweet beverages. This includes juice, soda, and sweet tea.     Drink at least 1 glass of water with each meal and aim for at least 8 glasses per day   Exercise at least 150 minutes every week.    Preventive Care 35-52 Years Old, Female Preventive care refers to visits with your health care provider and lifestyle choices that can promote health and wellness. This includes:  A yearly physical exam. This may also be called an annual well check.  Regular dental visits and eye exams.  Immunizations.  Screening for certain conditions.  Healthy lifestyle choices, such as eating a healthy diet, getting regular exercise, not using drugs or products that contain nicotine and tobacco, and limiting alcohol use. What can I expect for my preventive care visit? Physical exam Your health care provider will check your:  Height and weight. This may be used to calculate body mass index (BMI), which tells if you are at a healthy weight.  Heart rate and blood pressure.  Skin for abnormal spots. Counseling Your health care provider may ask you questions about your:  Alcohol, tobacco, and drug use.  Emotional well-being.  Home and  relationship well-being.  Sexual activity.  Eating habits.  Work and work Statistician.  Method of birth control.  Menstrual cycle.  Pregnancy history. What immunizations do I need?  Influenza (flu) vaccine  This is recommended every year. Tetanus, diphtheria, and pertussis (Tdap) vaccine  You may need a Td booster every 10 years. Varicella (chickenpox) vaccine  You may need this if you have not been vaccinated. Zoster (shingles) vaccine  You may need this after age 24. Measles, mumps, and rubella (MMR) vaccine  You may need at least one dose of MMR if you were born in 1957 or later. You may also need a second dose. Pneumococcal conjugate (PCV13) vaccine  You may need this if you have certain conditions and were not previously vaccinated. Pneumococcal polysaccharide (PPSV23) vaccine  You may need one or two doses if you smoke cigarettes or if you have certain conditions. Meningococcal conjugate (MenACWY) vaccine  You may need this if you have certain conditions. Hepatitis A vaccine  You may need this if you have certain conditions or if you travel or work in places where you may be exposed to hepatitis A. Hepatitis B vaccine  You may need this if you have certain conditions or if you travel or work in places where you may be exposed to hepatitis B. Haemophilus influenzae type b (Hib) vaccine  You may need this if you  have certain conditions. Human papillomavirus (HPV) vaccine  If recommended by your health care provider, you may need three doses over 6 months. You may receive vaccines as individual doses or as more than one vaccine together in one shot (combination vaccines). Talk with your health care provider about the risks and benefits of combination vaccines. What tests do I need? Blood tests  Lipid and cholesterol levels. These may be checked every 5 years, or more frequently if you are over 80 years old.  Hepatitis C test.  Hepatitis B  test. Screening  Lung cancer screening. You may have this screening every year starting at age 93 if you have a 30-pack-year history of smoking and currently smoke or have quit within the past 15 years.  Colorectal cancer screening. All adults should have this screening starting at age 35 and continuing until age 66. Your health care provider may recommend screening at age 73 if you are at increased risk. You will have tests every 1-10 years, depending on your results and the type of screening test.  Diabetes screening. This is done by checking your blood sugar (glucose) after you have not eaten for a while (fasting). You may have this done every 1-3 years.  Mammogram. This may be done every 1-2 years. Talk with your health care provider about when you should start having regular mammograms. This may depend on whether you have a family history of breast cancer.  BRCA-related cancer screening. This may be done if you have a family history of breast, ovarian, tubal, or peritoneal cancers.  Pelvic exam and Pap test. This may be done every 3 years starting at age 79. Starting at age 62, this may be done every 5 years if you have a Pap test in combination with an HPV test. Other tests  Sexually transmitted disease (STD) testing.  Bone density scan. This is done to screen for osteoporosis. You may have this scan if you are at high risk for osteoporosis. Follow these instructions at home: Eating and drinking  Eat a diet that includes fresh fruits and vegetables, whole grains, lean protein, and low-fat dairy.  Take vitamin and mineral supplements as recommended by your health care provider.  Do not drink alcohol if: ? Your health care provider tells you not to drink. ? You are pregnant, may be pregnant, or are planning to become pregnant.  If you drink alcohol: ? Limit how much you have to 0-1 drink a day. ? Be aware of how much alcohol is in your drink. In the [place] one drink equals one 12  oz bottle of beer (355 mL), one 5 oz glass of wine (148 mL), or one 1 oz glass of hard liquor (44 mL). Lifestyle  Take daily care of your teeth and gums.  Stay active. Exercise for at least 30 minutes on 5 or more days each week.  Do not use any products that contain nicotine or tobacco, such as cigarettes, e-cigarettes, and chewing tobacco. If you need help quitting, ask your health care provider.  If you are sexually active, practice safe sex. Use a condom or other form of birth control (contraception) in order to prevent pregnancy and STIs (sexually transmitted infections).  If told by your health care provider, take low-dose aspirin daily starting at age 70. What's next?  Visit your health care provider once a year for a well check visit.  Ask your health care provider how often you should have your eyes and teeth checked.  Stay up  to date on all vaccines. This information is not intended to replace advice given to you by your health care provider. Make sure you discuss any questions you have with your health care provider. Document Revised: 06/19/2018 Document Reviewed: 06/19/2018 Elsevier Patient Education  2020 [employer].

## 2020-08-01 NOTE — Progress Notes (Signed)
Chief Complaint:  Brandy [name] is a 59 y.o. female who presents today for her annual comprehensive physical exam.    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  Will start on 2-week course of meloxicam.  Discussed reasons to return to care  Chronic Problems Addressed Today: Osteoarthritis Stable.  Recommended over-the-counter Voltaren.  Can use over-the-counter Aleve as well.  Hyperglycemia Check A1c.  Vitamin D deficiency Check vitamin D.  Continue 1000 international units daily.  Dyslipidemia Check lipid panel.  Continue lifestyle modifications.  Preventative Healthcare: Flu vaccine given today.  Check CBC, CMET, TSH, lipid panel, A1c.  Patient Counseling(The following topics were reviewed and/or handout was given):  -Nutrition: Stressed importance of moderation in sodium/caffeine intake, saturated fat and cholesterol, caloric balance, sufficient intake of fresh fruits, vegetables, and fiber.  -Stressed the importance of regular exercise.   -Substance Abuse: Discussed cessation/primary prevention of tobacco, alcohol, or other drug use; driving or other dangerous activities under the influence; availability of treatment for abuse.   -Injury prevention: Discussed safety belts, safety helmets, smoke detector, smoking near bedding or upholstery.   -Sexuality: Discussed sexually transmitted diseases, partner selection, use of condoms, avoidance of unintended pregnancy and contraceptive alternatives.   -Dental health: Discussed importance of regular tooth brushing, flossing, and dental visits.  -Health maintenance and immunizations reviewed. Please refer to Health maintenance section.  Return to care in 1 year for next preventative visit.     Subjective:  HPI:  She has been having some left-sided neck pain for the past couple of weeks.  Located in her jaw.  Worse with chewing.  Worse with certain motions.  Tried Aleve with modest improvement.  Symptoms  are overall stable.  She has also had some bilateral elbow pain and hand pain.  No obvious injuries or precipitating events.  Worse with certain motions.  Lifestyle Diet: Trying to cut down on sweets and sugar.  Exercise: Tries to exercise daily.   Depression screen [facility] 2/9 07/13/2019  Decreased Interest 0  Down, Depressed, Hopeless 0  PHQ - 2 Score 0  Altered sleeping 0  Tired, decreased energy 0  Change in appetite 0  Feeling bad or failure about yourself  0  Trouble concentrating 0  Moving slowly or fidgety/restless 0  Suicidal thoughts 0  PHQ-9 Score 0  Difficult doing work/chores Not difficult at all    There are no preventive care reminders to display for this patient.   ROS: Per HPI, otherwise a complete review of systems was negative.   PMH:  The following were reviewed and entered/updated in epic: History reviewed. No pertinent past medical history. Patient Active Problem List   Diagnosis Date Noted  . Osteoarthritis 08/01/2020  . Vitamin D deficiency 07/13/2019  . Hyperglycemia 07/13/2019  . Dyslipidemia 10/08/2018  . Elevated blood pressure reading 10/06/2018  . Chronic pain of both knees 10/06/2018  . Plantar fasciitis 10/06/2018   Past Surgical History:  Procedure Laterality Date  . MYOMECTOMY     2007    History reviewed. No pertinent family history.  Medications- reviewed and updated Current Outpatient Medications  Medication Sig Dispense Refill  . BIOTIN PO Take by mouth.    . cholecalciferol (VITAMIN D3) 25 MCG (1000 UNIT) tablet Take 1,000 Units by mouth daily.    . Glucosamine-Chondroit-Vit C-Mn (GLUCOSAMINE 1500 COMPLEX PO) Take by mouth.    . Zinc 50 MG CAPS Take by mouth.    . meloxicam (MOBIC) 15 MG tablet Take 1 tablet (15  mg total) by mouth daily. 30 tablet 0   No current facility-administered medications for this visit.    Allergies-reviewed and updated No Known Allergies  Social History   Socioeconomic History  . Marital  status: Married    Spouse name: Not on file  . Number of children: Not on file  . Years of education: Not on file  . Highest education level: Not on file  Occupational History  . Not on file  Tobacco Use  . Smoking status: Never Smoker  . Smokeless tobacco: Never Used  Substance and Sexual Activity  . Alcohol use: Not Currently  . Drug use: Never  . Sexual activity: Not on file  Other Topics Concern  . Not on file  Social History Narrative  . Not on file   Social Determinants of Health   Financial Resource Strain:   . Difficulty of Paying Living Expenses: Not on file  Food Insecurity:   . Worried About Programme researcher, broadcasting/film/video in the Last Year: Not on file  . Ran Out of Food in the Last Year: Not on file  Transportation Needs:   . Lack of Transportation (Medical): Not on file  . Lack of Transportation (Non-Medical): Not on file  Physical Activity:   . Days of Exercise per Week: Not on file  . Minutes of Exercise per Session: Not on file  Stress:   . Feeling of Stress : Not on file  Social Connections:   . Frequency of Communication with Friends and Family: Not on file  . Frequency of Social Gatherings with Friends and Family: Not on file  . Attends Religious Services: Not on file  . Active Member of Clubs or Organizations: Not on file  . Attends Banker Meetings: Not on file  . Marital Status: Not on file        Objective:  Physical Exam: BP (!) 145/81   Pulse 79   Temp 98.1 F (36.7 C) (Temporal)   Ht 5\' 1"  (1.549 m)   Wt 141 lb 9.6 oz (64.2 kg)   SpO2 99%   BMI 26.76 kg/m   Body mass index is 26.76 kg/m. Wt Readings from Last 3 Encounters:  08/01/20 141 lb 9.6 oz (64.2 kg)  07/13/19 132 lb 4 oz (60 kg)  10/06/18 135 lb 9.6 oz (61.5 kg)   Gen: NAD, resting comfortably HEENT: TMs normal bilaterally. OP clear. No thyromegaly noted.  Pain with palpation of left TMJ CV: RRR with no murmurs appreciated Pulm: NWOB, CTAB with no crackles, wheezes, or  rhonchi GI: Normal bowel sounds present. Soft, Nontender, Nondistended. MSK: no edema, cyanosis, or clubbing noted.  Pain at bilateral epicondyles Skin: warm, dry Neuro: CN2-12 grossly intact. Strength 5/5 in upper and lower extremities. Reflexes symmetric and intact bilaterally.  Psych: Normal affect and thought content     Luisana Lutzke M. 10/08/18, MD 08/01/2020 2:07 PM

## 2020-08-01 NOTE — Assessment & Plan Note (Signed)
Stable.  Recommended over-the-counter Voltaren.  Can use over-the-counter Aleve as well.

## 2020-08-01 NOTE — Assessment & Plan Note (Signed)
Check A1c. 

## 2020-08-01 NOTE — Assessment & Plan Note (Signed)
Check lipid panel.  Continue lifestyle modifications. 

## 2020-08-02 ENCOUNTER — Encounter: Payer: Self-pay | Admitting: Family Medicine

## 2020-08-02 ENCOUNTER — Other Ambulatory Visit: Payer: Self-pay

## 2020-08-02 LAB — COMPREHENSIVE METABOLIC PANEL
AG Ratio: 1.3 (calc) (ref 1.0–2.5)
ALT: 21 U/L (ref 6–29)
AST: 20 U/L (ref 10–35)
Albumin: 4.1 g/dL (ref 3.6–5.1)
Alkaline phosphatase (APISO): 67 U/L (ref 37–153)
BUN: 16 mg/dL (ref 7–25)
CO2: 30 mmol/L (ref 20–32)
Calcium: 9.5 mg/dL (ref 8.6–10.4)
Chloride: 102 mmol/L (ref 98–110)
Creat: 0.74 mg/dL (ref 0.50–1.05)
Globulin: 3.1 g/dL (calc) (ref 1.9–3.7)
Glucose, Bld: 95 mg/dL (ref 65–99)
Potassium: 4 mmol/L (ref 3.5–5.3)
Sodium: 138 mmol/L (ref 135–146)
Total Bilirubin: 0.5 mg/dL (ref 0.2–1.2)
Total Protein: 7.2 g/dL (ref 6.1–8.1)

## 2020-08-02 LAB — CBC
HCT: 41 % (ref 35.0–45.0)
Hemoglobin: 13.8 g/dL (ref 11.7–15.5)
MCH: 31.2 pg (ref 27.0–33.0)
MCHC: 33.7 g/dL (ref 32.0–36.0)
MCV: 92.8 fL (ref 80.0–100.0)
MPV: 10.4 fL (ref 7.5–12.5)
Platelets: 285 10*3/uL (ref 140–400)
RBC: 4.42 10*6/uL (ref 3.80–5.10)
RDW: 12.6 % (ref 11.0–15.0)
WBC: 6.3 10*3/uL (ref 3.8–10.8)

## 2020-08-02 LAB — LIPID PANEL
Cholesterol: 225 mg/dL — ABNORMAL HIGH (ref <200)
HDL: 49 mg/dL — ABNORMAL LOW (ref >=50)
LDL Cholesterol (Calc): 130 mg/dL (calc) — ABNORMAL HIGH
Non-HDL Cholesterol (Calc): 176 mg/dL (calc) — ABNORMAL HIGH (ref <130)
Total CHOL/HDL Ratio: 4.6 (calc) (ref <5.0)
Triglycerides: 319 mg/dL — ABNORMAL HIGH (ref <150)

## 2020-08-02 LAB — VITAMIN B12: Vitamin B-12: 276 pg/mL (ref 200–1100)

## 2020-08-02 LAB — HEMOGLOBIN A1C
Hgb A1c MFr Bld: 5.5 % of total Hgb (ref <5.7)
Mean Plasma Glucose: 111 (calc)
eAG (mmol/L): 6.2 (calc)

## 2020-08-02 LAB — VITAMIN D 25 HYDROXY (VIT D DEFICIENCY, FRACTURES): Vit D, 25-Hydroxy: 22 ng/mL — ABNORMAL LOW (ref 30–100)

## 2020-08-02 LAB — TSH: TSH: 3.37 mIU/L (ref 0.40–4.50)

## 2020-08-02 MED ORDER — VITAMIN D (ERGOCALCIFEROL) 1.25 MG (50000 UNIT) PO CAPS: 50000.0000 [IU] | ORAL_CAPSULE | ORAL | 0 refills | Status: DC

## 2020-08-02 NOTE — Progress Notes (Signed)
Please inform patient of the following:  Vitamin D is low. Recommend starting 50,000IU weekly and we can recheck in 3-6 months. Cholesterol is borderline. Do not need to start meds but she should continue working on diet and exercise. We can recheck everything in a year.  Katina Degree. [name], MD 08/02/2020 3:36 PM

## 2020-08-10 ENCOUNTER — Encounter: Payer: Self-pay | Admitting: Family Medicine

## 2020-08-13 ENCOUNTER — Encounter: Payer: Self-pay | Admitting: Family Medicine

## 2020-08-25 ENCOUNTER — Other Ambulatory Visit: Payer: Self-pay | Admitting: Family Medicine

## 2020-09-05 ENCOUNTER — Encounter: Payer: Self-pay | Admitting: Family Medicine

## 2020-09-08 ENCOUNTER — Other Ambulatory Visit: Payer: Self-pay | Admitting: Family Medicine

## 2020-09-08 DIAGNOSIS — Z1231 Encounter for screening mammogram for malignant neoplasm of breast: Secondary | ICD-10-CM

## 2020-09-09 ENCOUNTER — Other Ambulatory Visit: Payer: Self-pay

## 2020-09-09 ENCOUNTER — Ambulatory Visit
Admission: RE | Admit: 2020-09-09 | Discharge: 2020-09-09 | Disposition: A | Discharge status: Home / Self Care | Payer: BC Managed Care – PPO | Source: Ambulatory Visit | Attending: Family Medicine | Admitting: Family Medicine

## 2020-09-09 DIAGNOSIS — Z1231 Encounter for screening mammogram for malignant neoplasm of breast: Secondary | ICD-10-CM

## 2020-10-21 ENCOUNTER — Other Ambulatory Visit: Payer: Self-pay | Admitting: Family Medicine

## 2020-12-22 ENCOUNTER — Encounter: Payer: Self-pay | Admitting: Family Medicine

## 2020-12-22 ENCOUNTER — Other Ambulatory Visit: Payer: Self-pay

## 2020-12-22 DIAGNOSIS — M1990 Unspecified osteoarthritis, unspecified site: Secondary | ICD-10-CM

## 2020-12-26 ENCOUNTER — Telehealth (INDEPENDENT_AMBULATORY_CARE_PROVIDER_SITE_OTHER): Payer: BC Managed Care – PPO | Admitting: Family Medicine

## 2020-12-26 ENCOUNTER — Encounter: Payer: Self-pay | Admitting: Family Medicine

## 2020-12-26 DIAGNOSIS — K219 Gastro-esophageal reflux disease without esophagitis: Secondary | ICD-10-CM | POA: Diagnosis not present

## 2020-12-26 DIAGNOSIS — M79641 Pain in right hand: Secondary | ICD-10-CM | POA: Diagnosis not present

## 2020-12-26 DIAGNOSIS — M79642 Pain in left hand: Secondary | ICD-10-CM | POA: Insufficient documentation

## 2020-12-26 NOTE — Progress Notes (Signed)
° °  Brandy [name] is a 60 y.o. female who presents today for a telephone visit.  Assessment/Plan:  New/Acute Problems: Diarrhea Concern for possible infectious etiology.  She will come in we will check stool sample study.  Given her degree of weight loss and bloating with abdominal pain will also place referral to GI for further evaluation.  Reportedly her gastroenterologist improved told her that she would need an endoscopy soon.  Recommended Pepto-Bismol or Imodium as needed.  Encourage good oral hydration.  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  She would like to be seen by rheumatologist in the [place] at some point soon.  I do not have any records available to review.  GERD (gastroesophageal reflux disease) Stable on omeprazole 20 mg daily.     Subjective:  HPI:  Patient with abdominal bloating, pain, heartburn, and diarrhea for the last month.  She was in [place] for several weeks.  She saw a gastroenterologist there who started her on omeprazole.  This helped with the heartburn however she has had persistent diarrhea for the last several weeks.  No hematochezia.  No melena.  She has had some weight loss.  Still is very frequently watery though can sometimes just be soft and loose.  No reported fevers or chills.  No recent antibiotics.  No specific treatments tried.        Objective/Observations   NAD  Telephone Visit   I connected with Victorino Dike on 12/26/20 at  3:40 PM EST via telephone and verified that I am speaking with the correct person using two identifiers. I discussed the limitations of evaluation and management by telemedicine and the availability of in person appointments. The patient expressed understanding and agreed to proceed.   Patient location: Home Provider location: [place] [employer] Persons participating in the virtual  visit: Myself and Patient      Katina Degree. [name], MD 12/26/2020 4:02 PM

## 2020-12-26 NOTE — Addendum Note (Signed)
Addended by: Vincenza Hews on: 12/26/2020 04:36 PM   Modules accepted: Orders

## 2020-12-26 NOTE — Assessment & Plan Note (Signed)
Stable on omeprazole 20mg daily.  

## 2020-12-26 NOTE — Assessment & Plan Note (Signed)
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  She would like to be seen by rheumatologist in the [place] at some point soon.  I do not have any records available to review.

## 2020-12-26 NOTE — Telephone Encounter (Signed)
Patient has virtual appointment today with PCP

## 2021-01-12 ENCOUNTER — Encounter: Payer: Self-pay | Admitting: Family Medicine

## 2021-02-07 ENCOUNTER — Telehealth: Payer: Self-pay

## 2021-02-07 NOTE — Telephone Encounter (Signed)
[facility]. They will call pt to schedule.

## 2021-02-07 NOTE — Telephone Encounter (Signed)
Received call from [facility] stating patient referral to their clinic was denied.

## 2021-02-23 DIAGNOSIS — M79641 Pain in right hand: Secondary | ICD-10-CM | POA: Diagnosis not present

## 2021-02-23 DIAGNOSIS — M79642 Pain in left hand: Secondary | ICD-10-CM | POA: Diagnosis not present

## 2021-02-23 DIAGNOSIS — Z6826 Body mass index (BMI) 26.0-26.9, adult: Secondary | ICD-10-CM | POA: Diagnosis not present

## 2021-02-23 DIAGNOSIS — M8580 Other specified disorders of bone density and structure, unspecified site: Secondary | ICD-10-CM | POA: Diagnosis not present

## 2021-03-02 ENCOUNTER — Encounter: Payer: Self-pay | Admitting: Gastroenterology

## 2021-03-02 ENCOUNTER — Other Ambulatory Visit (INDEPENDENT_AMBULATORY_CARE_PROVIDER_SITE_OTHER): Payer: BC Managed Care – PPO

## 2021-03-02 ENCOUNTER — Ambulatory Visit: Payer: BC Managed Care – PPO | Admitting: Gastroenterology

## 2021-03-02 VITALS — BP 128/82 | HR 66 | Ht 61.0 in | Wt 140.1 lb

## 2021-03-02 DIAGNOSIS — K219 Gastro-esophageal reflux disease without esophagitis: Secondary | ICD-10-CM

## 2021-03-02 DIAGNOSIS — R109 Unspecified abdominal pain: Secondary | ICD-10-CM | POA: Diagnosis not present

## 2021-03-02 DIAGNOSIS — E7800 Pure hypercholesterolemia, unspecified: Secondary | ICD-10-CM | POA: Diagnosis not present

## 2021-03-02 DIAGNOSIS — R1013 Epigastric pain: Secondary | ICD-10-CM

## 2021-03-02 LAB — LIPID PANEL
Cholesterol: 251 mg/dL — ABNORMAL HIGH (ref 0–200)
HDL: 52.7 mg/dL (ref >39.00)
NonHDL: 198.01
Total CHOL/HDL Ratio: 5
Triglycerides: 227 mg/dL — ABNORMAL HIGH (ref 0.0–149.0)
VLDL: 45.4 mg/dL — ABNORMAL HIGH (ref 0.0–40.0)

## 2021-03-02 LAB — HEPATIC FUNCTION PANEL
ALT: 17 U/L (ref 0–35)
AST: 15 U/L (ref 0–37)
Albumin: 4.3 g/dL (ref 3.5–5.2)
Alkaline Phosphatase: 67 U/L (ref 39–117)
Bilirubin, Direct: 0.1 mg/dL (ref 0.0–0.3)
Total Bilirubin: 0.7 mg/dL (ref 0.2–1.2)
Total Protein: 7.7 g/dL (ref 6.0–8.3)

## 2021-03-02 LAB — LDL CHOLESTEROL, DIRECT: Direct LDL: 166 mg/dL

## 2021-03-02 NOTE — Patient Instructions (Signed)
Your provider has requested that you go to the basement level for lab work before leaving today. Press "B" on the elevator. The lab is located at the first door on the left as you exit the elevator.  Due to recent changes in healthcare laws, you may see the results of your imaging and laboratory studies on MyChart before your provider has had a chance to review them.  We understand that in some cases there may be results that are confusing or concerning to you. Not all laboratory results come back in the same time frame and the provider may be waiting for multiple results in order to interpret others.  Please give [place] 48 hours in order for your provider to thoroughly review all the results before contacting the office for clarification of your results.   Follow up as needed.  Thank you for choosing me and [facility].  Dr. Meridee Score

## 2021-03-03 LAB — TISSUE TRANSGLUTAMINASE, IGA: (tTG) Ab, IgA: <1 U/mL

## 2021-03-03 LAB — IGA: Immunoglobulin A: 218 mg/dL (ref 47–310)

## 2021-03-06 ENCOUNTER — Encounter: Payer: Self-pay | Admitting: Family Medicine

## 2021-03-06 ENCOUNTER — Other Ambulatory Visit: Payer: BC Managed Care – PPO

## 2021-03-06 DIAGNOSIS — R109 Unspecified abdominal pain: Secondary | ICD-10-CM

## 2021-03-06 DIAGNOSIS — E7800 Pure hypercholesterolemia, unspecified: Secondary | ICD-10-CM | POA: Diagnosis not present

## 2021-03-06 DIAGNOSIS — K219 Gastro-esophageal reflux disease without esophagitis: Secondary | ICD-10-CM

## 2021-03-07 ENCOUNTER — Other Ambulatory Visit: Payer: Self-pay

## 2021-03-07 DIAGNOSIS — A048 Other specified bacterial intestinal infections: Secondary | ICD-10-CM

## 2021-03-07 MED ORDER — AMOXICILL-CLARITHRO-LANSOPRAZ PO MISC: ORAL | 0 refills | Status: DC

## 2021-03-08 ENCOUNTER — Encounter: Payer: Self-pay | Admitting: Gastroenterology

## 2021-03-08 DIAGNOSIS — E7800 Pure hypercholesterolemia, unspecified: Secondary | ICD-10-CM | POA: Insufficient documentation

## 2021-03-08 DIAGNOSIS — R1013 Epigastric pain: Secondary | ICD-10-CM | POA: Insufficient documentation

## 2021-03-08 NOTE — Progress Notes (Signed)
GASTROENTEROLOGY OUTPATIENT CLINIC VISIT   Primary Care Provider Brandy Dark, MD [address] [place] [address] [phone]  Referring Provider Brandy Dark, MD [address] [place],  [place] [address] [phone]  Patient Profile: Brandy Brandy [name] is a 60 y.o. female PMH significant for hyperlipidemia.  The patient presents to the [facility] for an evaluation and management of problem(s) noted below:  Problem List 1. Gastroesophageal reflux disease, unspecified whether esophagitis present   2. Epigastric pain   3. Elevated cholesterol     History of Present Illness This is the patient's first visit to the outpatient [facility].  Her husband is my patient.  Patient is generally very healthy.  She was in her home country of [place] earlier this year when she began to experience burning/pyrosis.  She was also having abdominal discomfort ever coming and going.  She ended up seeing a gastroenterologist in [place] and was to undergo laboratories as well as imaging as well as an endoscopy but as she was returning to the [place] she did not have all of these procedures performed.  She did take PPI therapy for a couple of weeks with some improvement.  She did have basic laboratories obtained although liver tests were not present she was found to have persistent elevated cholesterol.  Patient has soon as she got home and had some persistent symptoms for which a GI referral was placed.  The symptoms gradually abated.  She felt that anxiety and stress was causing her to have more issues as she was leaving the country and now she is doing better.  She has had GERD symptoms in the past but does not need to take medications for them at this time.  She currently is not having any abdominal pain or discomfort.  The patient does not take significant nonsteroidals or BC/Goody powders.  She has never had colon cancer screening.  GI Review of Systems Positive as  above including bloating Negative for dysphagia, odynophagia, nausea, vomiting, change in bowel habits, melena, hematochezia  Review of Systems General: Denies fevers/chills/weight loss unintentionally HEENT: Denies oral lesions Cardiovascular: Denies current chest pain/palpitations Pulmonary: Denies shortness of breath/nocturnal cough Gastroenterological: See HPI Genitourinary: Denies darkened urine Hematological: Denies easy bruising/bleeding Endocrine: Denies temperature intolerance Dermatological: Denies jaundice Psychological: Mood is stable   Medications Current Outpatient Medications  Medication Sig Dispense Refill  . cholecalciferol (VITAMIN D3) 25 MCG (1000 UNIT) tablet Take 1,000 Units by mouth daily.    Marland Kitchen amoxicillin-clarithromycin-lansoprazole (PREVPAC) combo pack Take by mouth as directed for 14 days. Follow package directions. 1 each 0   Brandy [name] current facility-administered medications for this visit.    Allergies Brandy [name] Known Allergies  Histories History reviewed. Brandy [name] pertinent past medical history. Past Surgical History:  Procedure Laterality Date  . MYOMECTOMY     2007   Social History   Socioeconomic History  . Marital status: Married    Spouse name: Not on file  . Number of children: Not on file  . Years of education: Not on file  . Highest education level: Not on file  Occupational History  . Not on file  Tobacco Use  . Smoking status: Never Smoker  . Smokeless tobacco: Never Used  Vaping Use  . Vaping Use: Never used  Substance and Sexual Activity  . Alcohol use: Not Currently  . Drug use: Never  . Sexual activity: Not on file  Other Topics Concern  . Not on file  Social History Narrative  .  Not on file   Social Determinants of Health   Financial Resource Strain: Not on file  Food Insecurity: Not on file  Transportation Needs: Not on file  Physical Activity: Not on file  Stress: Not on file  Social Connections: Not on file  Intimate  Partner Violence: Not on file   Family History  Problem Relation Age of Onset  . Parkinson's disease Mother   . Hypertension Father   . Diverticulitis Father   . Heart attack Father   . Colon cancer Neg Hx   . Esophageal cancer Neg Hx   . Pancreatic cancer Neg Hx   . Stomach cancer Neg Hx   . Inflammatory bowel disease Neg Hx   . Liver disease Neg Hx   . Rectal cancer Neg Hx    I have reviewed her medical, social, and family history in detail and updated the electronic medical record as necessary.    PHYSICAL EXAMINATION  BP 128/82 (BP Location: Left Arm, Patient Position: Sitting, Cuff Size: Normal)   Pulse 66   Ht 5\' 1"  (1.549 m)   Wt 140 lb 2 oz (63.6 kg)   BMI 26.48 kg/m  Wt Readings from Last 3 Encounters:  03/02/21 140 lb 2 oz (63.6 kg)  12/26/20 134 lb (60.8 kg)  08/01/20 141 lb 9.6 oz (64.2 kg)  GEN: NAD, appears stated age, doesn't appear chronically ill, accompanied by husband PSYCH: Cooperative, without pressured speech EYE: Conjunctivae pink, sclerae anicteric ENT: MMM, without oral ulcers, Brandy [name] erythema or exudates noted CV: RR without R/Gs  RESP: CTAB posteriorly, without wheezing GI: NABS, soft, NT/ND, without rebound or guarding, Brandy [name] HSM appreciated MSK/EXT: Brandy [name] lower extremity edema SKIN: Brandy [name] jaundice NEURO:  Alert & Oriented x 3, Brandy [name] focal deficits   REVIEW OF DATA  I reviewed the following data at the time of this encounter:  GI Procedures and Studies  Brandy [name] relevant studies to review  Laboratory Studies  Reviewed those in epic  Imaging Studies  Brandy [name] relevant studies to review   ASSESSMENT  Ms. Brandy Brandy [name] is a 60 y.o. female with a pmh significant for hyperlipidemia.  The patient is seen today for evaluation and management of:  1. Gastroesophageal reflux disease, unspecified whether esophagitis present   2. Epigastric pain   3. Elevated cholesterol    The patient is clinically and hemodynamically stable at this time.  Etiology of her symptoms  previously could be underlying gastritis or PUD.  Based on her history and country of origin, H. pylori infection should be evaluated for.  We will move forward with H. pylori stool antigen testing.  I will also obtain laboratories to further evaluate any sort of abnormalities in regards to potential for celiac disease even though many of her symptoms are improved at this time bloating remains an issue for her at times.  We will obtain pancreas elastase testing to rule out exocrine pancreas insufficiency for her chronic recurrent bloating.  I will obtain a lipid profile due to her history of hyperlipidemia and that will be followed up further by her PCP if things are found to be elevated.  We will consider abdominal imaging if the patient's symptoms recur and we do not find an etiology based on the work-up outlined here today.  Endoscopic evaluation may be necessary as well but as she is doing well we will hold on that currently.  All patient questions were answered to the best of my ability, and the patient agrees to the aforementioned plan of  action with follow-up as indicated.   PLAN  Laboratories as outlined below H. pylori stool antigen to be obtained Fecal elastase testing to be obtained Consider SIBO breath testing in future if bloating issues persist Obtaining lipid profile since patient is having labs but will forward that to PCP if patient remains with hyperlipidemia issues If patient's symptoms recur we will consider PPI trial again and abdominal imaging and potentially diagnostic endoscopy   Orders Placed This Encounter  Procedures  . Helicobacter pylori special antigen  . Hepatic function panel  . Lipid Profile  . Tissue transglutaminase, IgA  . Pancreatic elastase, fecal  . IgA    New Prescriptions   AMOXICILLIN-CLARITHROMYCIN-LANSOPRAZOLE (PREVPAC) COMBO PACK    Take by mouth as directed for 14 days. Follow package directions.   Modified Medications   Brandy [name] medications on file     Planned Follow Up Brandy [name] follow-ups on file.   Total Time in Face-to-Face and in Coordination of Care for patient including independent/personal interpretation/review of prior testing, medical history, examination, medication adjustment, communicating results with the patient directly, and documentation with the EHR is 45 minutes   Corliss Parish, MD [facility]enterology Advanced Endoscopy Office # [phone]

## 2021-03-13 LAB — HELICOBACTER PYLORI  SPECIAL ANTIGEN
MICRO NUMBER:: 11894185
RESULT:: DETECTED — AB
SPECIMEN QUALITY: ADEQUATE

## 2021-03-13 LAB — PANCREATIC ELASTASE, FECAL: Pancreatic Elastase-1, Stool: >500 mcg/g

## 2021-03-24 ENCOUNTER — Telehealth: Payer: Self-pay | Admitting: Gastroenterology

## 2021-03-24 NOTE — Telephone Encounter (Signed)
Patient calling wants to know what the next step will be regarding her treatment since she has  completed the med packet..  Plz advise  Thanks

## 2021-03-24 NOTE — Telephone Encounter (Signed)
The pt has been advised that she should come in for h pylori stool antigen 1 month after completing abx.  The pt has been advised of the information and verbalized understanding.

## 2021-03-27 ENCOUNTER — Ambulatory Visit: Payer: BC Managed Care – PPO | Admitting: Family Medicine

## 2021-04-03 ENCOUNTER — Ambulatory Visit: Payer: BC Managed Care – PPO | Admitting: Family Medicine

## 2021-04-12 ENCOUNTER — Other Ambulatory Visit: Payer: BC Managed Care – PPO

## 2021-04-12 DIAGNOSIS — A048 Other specified bacterial intestinal infections: Secondary | ICD-10-CM | POA: Diagnosis not present

## 2021-04-13 ENCOUNTER — Other Ambulatory Visit: Payer: Self-pay

## 2021-04-13 ENCOUNTER — Encounter: Payer: Self-pay | Admitting: Family Medicine

## 2021-04-13 ENCOUNTER — Ambulatory Visit: Payer: BC Managed Care – PPO | Admitting: Family Medicine

## 2021-04-13 VITALS — BP 118/80 | HR 70 | Temp 98.3°F | Ht 61.0 in | Wt 139.0 lb

## 2021-04-13 DIAGNOSIS — E785 Hyperlipidemia, unspecified: Secondary | ICD-10-CM

## 2021-04-13 DIAGNOSIS — E559 Vitamin D deficiency, unspecified: Secondary | ICD-10-CM | POA: Diagnosis not present

## 2021-04-13 DIAGNOSIS — M722 Plantar fascial fibromatosis: Secondary | ICD-10-CM

## 2021-04-13 LAB — HELICOBACTER PYLORI  SPECIAL ANTIGEN
MICRO NUMBER:: 12037223
SPECIMEN QUALITY: ADEQUATE

## 2021-04-13 LAB — LIPID PANEL
Cholesterol: 233 mg/dL — ABNORMAL HIGH (ref 0–200)
HDL: 55.2 mg/dL (ref >39.00)
LDL Cholesterol: 152 mg/dL — ABNORMAL HIGH (ref 0–99)
NonHDL: 178.28
Total CHOL/HDL Ratio: 4
Triglycerides: 132 mg/dL (ref 0.0–149.0)
VLDL: 26.4 mg/dL (ref 0.0–40.0)

## 2021-04-13 NOTE — Assessment & Plan Note (Signed)
Follows with rheumatology.  She is on 1000 IUs daily.  We can recheck again when she comes in for CPE.

## 2021-04-13 NOTE — Assessment & Plan Note (Signed)
Discussed her recent cholesterol levels.  We will recheck lipid panel today per patient request.  Her overall ASCVD is extremely low she will likely not need to start medications pending her results.  Discussed lifestyle modifications.

## 2021-04-13 NOTE — Patient Instructions (Signed)
It was very nice to see you today!  We will check your cholesterol levels today.  Please continue working on diet and exercise.  I will see back in the fall for your annual physical.  Take care, Dr [name]  PLEASE NOTE:  If you had any lab tests please let us know if you have not heard back within a few days. You may see your results on mychart before we have a chance to review them but we will give you a call once they are reviewed by [place]. If we ordered any referrals today, please let us know if you have not heard from their office within the next week.   Please try these tips to maintain a healthy lifestyle:  Eat at least 3 REAL meals and 1-2 snacks per day.  Aim for no more than 5 hours between eating.  If you eat breakfast, please do so within one hour of getting up.   Each meal should contain half fruits/vegetables, one quarter protein, and one quarter carbs (no bigger than a computer mouse)  Cut down on sweet beverages. This includes juice, soda, and sweet tea.   Drink at least 1 glass of water with each meal and aim for at least 8 glasses per day  Exercise at least 150 minutes every week.

## 2021-04-13 NOTE — Progress Notes (Signed)
   Brandy [name] is a 60 y.o. female who presents today for an office visit.  Assessment/Plan:  Chronic Problems Addressed Today: Vitamin D deficiency Follows with rheumatology.  She is on 1000 IUs daily.  We can recheck again when she comes in for CPE.  Dyslipidemia Discussed her recent cholesterol levels.  We will recheck lipid panel today per patient request.  Her overall ASCVD is extremely low she will likely not need to start medications pending her results.  Discussed lifestyle modifications.  Plantar fasciitis Discussed conservative measures.  She is wearing good footwear and arch support.  She can use over-the-counter meds as needed.     Subjective:  HPI:  See A/p.         Objective:  Physical Exam: BP 118/80   Pulse 70   Temp 98.3 F (36.8 C) (Temporal)   Ht 5\' 1"  (1.549 m)   Wt 139 lb (63 kg)   SpO2 97%   BMI 26.26 kg/m   Gen: No acute distress, resting comfortably CV: Regular rate and rhythm with no murmurs appreciated Pulm: Normal work of breathing, clear to auscultation bilaterally with no crackles, wheezes, or rhonchi Neuro: Grossly normal, moves all extremities Psych: Normal affect and thought content      Muadh Creasy M. , MD 04/13/2021 11:43 AM

## 2021-04-13 NOTE — Assessment & Plan Note (Signed)
Discussed conservative measures.  She is wearing good footwear and arch support.  She can use over-the-counter meds as needed.

## 2021-04-14 NOTE — Progress Notes (Signed)
Please inform patient of the following:  Cholesterol levels are all stable. Triglycerides are normal. Her "good" cholesterol is at a good level and her "bad" cholesterol is elevated but not enough where we need to start medications. We can recheck again when she comes in for her annual physical.  Brandy [name]. [name], MD 04/14/2021 8:10 AM

## 2021-07-03 ENCOUNTER — Encounter: Payer: Self-pay | Admitting: Family Medicine

## 2021-07-04 ENCOUNTER — Other Ambulatory Visit: Payer: Self-pay | Admitting: *Deleted

## 2021-07-04 ENCOUNTER — Ambulatory Visit: Payer: BC Managed Care – PPO | Admitting: Family Medicine

## 2021-07-04 ENCOUNTER — Encounter: Payer: Self-pay | Admitting: Family Medicine

## 2021-07-04 ENCOUNTER — Other Ambulatory Visit: Payer: Self-pay

## 2021-07-04 VITALS — BP 124/80 | HR 74 | Temp 98.4°F | Ht 61.0 in | Wt 138.2 lb

## 2021-07-04 DIAGNOSIS — N644 Mastodynia: Secondary | ICD-10-CM

## 2021-07-04 MED ORDER — MELOXICAM 15 MG PO TABS: 15.0000 mg | ORAL_TABLET | Freq: Every day | ORAL | 0 refills | Status: DC

## 2021-07-04 NOTE — Patient Instructions (Addendum)
It was very nice to see you today!  If you probably have a muscular strain.  Please start meloxicam.  We will get your  mammogram a couple of months early to make sure there is nothing else going on.  Please let me know if your pain does not improve.  We will contact you once we have results of your ultrasound and mammographic.  Take care, Dr [name]  PLEASE NOTE:  If you had any lab tests please let us know if you have not heard back within a few days. You may see your results on mychart before we have a chance to review them but we will give you a call once they are reviewed by [place]. If we ordered any referrals today, please let us know if you have not heard from their office within the next week.   Please try these tips to maintain a healthy lifestyle:  Eat at least 3 REAL meals and 1-2 snacks per day.  Aim for no more than 5 hours between eating.  If you eat breakfast, please do so within one hour of getting up.   Each meal should contain half fruits/vegetables, one quarter protein, and one quarter carbs (no bigger than a computer mouse)  Cut down on sweet beverages. This includes juice, soda, and sweet tea.   Drink at least 1 glass of water with each meal and aim for at least 8 glasses per day  Exercise at least 150 minutes every week.

## 2021-07-04 NOTE — Progress Notes (Signed)
   Brandy [name] is a 60 y.o. female who presents today for an office visit.  Assessment/Plan:  New/Acute Problems: Right breast pain Likely pectoralis strain.  We will start meloxicam 15mg  daily. She has been prescribed this in the past and has done well.  We will also check diagnostic mammogram to rule out neoplasm or malignancy.  She will let me know if not improving.  Discussed reasons to return to care.    Subjective:  HPI:  Patient here with right breast pain. She complains of pain in the right side of her right breast. She states it started few days ago  and it is intermittent. Has been improving slightly. The note pain started after playing pickleball on Wednesday.  She notes she feel pain when moving. Denies chest pain or shortness of breath.  Naproxen helps.        Objective:  Physical Exam: There were no vitals taken for this visit.  Gen: No acute distress, resting comfortably CV: Regular rate and rhythm with no murmurs appreciated Pulm: Normal work of breathing, clear to auscultation bilaterally with no crackles, wheezes, or rhonchi Chest: Chaperone present for exam.  Tenderness to palpation of right breast at the 10 o'clock position.  No palpable lumps or masses noted.  No discharge.  Left breast without abnormality. MSK: Full range of motion in the upper extremities.  No pain elicited with resisted abduction or extension of shoulder bilateraly Neuro: Grossly normal, moves all extremities Psych: Normal affect and thought content       I,Brandy [name],acting as a scribe for Tuesday, MD.,have documented all relevant documentation on the behalf of Brandy Doe, MD,as directed by  Brandy Doe, MD while in the presence of Brandy Doe, MD.   I, Brandy Doe, MD, have reviewed all documentation for this visit. The documentation on 07/04/21 for the exam, diagnosis, procedures, and orders are all accurate and complete.  07/06/21. Katina Degree, MD 07/04/2021 7:52 AM

## 2021-07-05 ENCOUNTER — Ambulatory Visit: Payer: BC Managed Care – PPO | Admitting: Physician Assistant

## 2021-07-07 ENCOUNTER — Encounter: Payer: Self-pay | Admitting: Family Medicine

## 2021-07-10 NOTE — Telephone Encounter (Signed)
See note

## 2021-07-10 NOTE — Telephone Encounter (Signed)
See Dr [name] notes  Thanks

## 2021-07-13 ENCOUNTER — Ambulatory Visit
Admission: RE | Admit: 2021-07-13 | Discharge: 2021-07-13 | Disposition: A | Discharge status: Home / Self Care | Payer: BC Managed Care – PPO | Source: Ambulatory Visit | Attending: Family Medicine | Admitting: Family Medicine

## 2021-07-13 ENCOUNTER — Other Ambulatory Visit: Payer: Self-pay

## 2021-07-13 DIAGNOSIS — R922 Inconclusive mammogram: Secondary | ICD-10-CM | POA: Diagnosis not present

## 2021-07-13 DIAGNOSIS — N644 Mastodynia: Secondary | ICD-10-CM

## 2021-07-14 NOTE — Progress Notes (Signed)
Please inform patient of the following:  Good news! HEr mammogram is NORMAL. Would like for her to let us know if her pain is not improving.  Brandy [name]. [name], MD 07/14/2021 3:14 PM

## 2021-07-14 NOTE — Telephone Encounter (Signed)
Patient aware, will call for appointment

## 2021-08-07 ENCOUNTER — Other Ambulatory Visit: Payer: Self-pay

## 2021-08-07 ENCOUNTER — Other Ambulatory Visit: Payer: BC Managed Care – PPO

## 2021-08-07 ENCOUNTER — Ambulatory Visit (INDEPENDENT_AMBULATORY_CARE_PROVIDER_SITE_OTHER): Payer: BC Managed Care – PPO | Admitting: Family Medicine

## 2021-08-07 VITALS — BP 136/83 | HR 78 | Temp 98.3°F | Ht 61.0 in | Wt 140.2 lb

## 2021-08-07 DIAGNOSIS — Z0001 Encounter for general adult medical examination with abnormal findings: Secondary | ICD-10-CM | POA: Diagnosis not present

## 2021-08-07 DIAGNOSIS — R739 Hyperglycemia, unspecified: Secondary | ICD-10-CM

## 2021-08-07 DIAGNOSIS — E785 Hyperlipidemia, unspecified: Secondary | ICD-10-CM

## 2021-08-07 DIAGNOSIS — Z6826 Body mass index (BMI) 26.0-26.9, adult: Secondary | ICD-10-CM

## 2021-08-07 DIAGNOSIS — Z23 Encounter for immunization: Secondary | ICD-10-CM | POA: Diagnosis not present

## 2021-08-07 DIAGNOSIS — E559 Vitamin D deficiency, unspecified: Secondary | ICD-10-CM

## 2021-08-07 DIAGNOSIS — E663 Overweight: Secondary | ICD-10-CM

## 2021-08-07 LAB — CBC
HCT: 40.7 % (ref 36.0–46.0)
Hemoglobin: 13.5 g/dL (ref 12.0–15.0)
MCHC: 33.3 g/dL (ref 30.0–36.0)
MCV: 93 fl (ref 78.0–100.0)
Platelets: 298 10*3/uL (ref 150.0–400.0)
RBC: 4.38 Mil/uL (ref 3.87–5.11)
RDW: 13.9 % (ref 11.5–15.5)
WBC: 5.1 10*3/uL (ref 4.0–10.5)

## 2021-08-07 LAB — COMPREHENSIVE METABOLIC PANEL
ALT: 22 U/L (ref 0–35)
AST: 20 U/L (ref 0–37)
Albumin: 4.3 g/dL (ref 3.5–5.2)
Alkaline Phosphatase: 77 U/L (ref 39–117)
BUN: 13 mg/dL (ref 6–23)
CO2: 25 mEq/L (ref 19–32)
Calcium: 9.1 mg/dL (ref 8.4–10.5)
Chloride: 104 mEq/L (ref 96–112)
Creatinine, Ser: 0.79 mg/dL (ref 0.40–1.20)
GFR: 81.6 mL/min (ref >60.00)
Glucose, Bld: 107 mg/dL — ABNORMAL HIGH (ref 70–99)
Potassium: 4.2 mEq/L (ref 3.5–5.1)
Sodium: 138 mEq/L (ref 135–145)
Total Bilirubin: 0.4 mg/dL (ref 0.2–1.2)
Total Protein: 7.1 g/dL (ref 6.0–8.3)

## 2021-08-07 LAB — LIPID PANEL
Cholesterol: 230 mg/dL — ABNORMAL HIGH (ref 0–200)
HDL: 51.7 mg/dL (ref >39.00)
LDL Cholesterol: 142 mg/dL — ABNORMAL HIGH (ref 0–99)
NonHDL: 178
Total CHOL/HDL Ratio: 4
Triglycerides: 181 mg/dL — ABNORMAL HIGH (ref 0.0–149.0)
VLDL: 36.2 mg/dL (ref 0.0–40.0)

## 2021-08-07 LAB — HEMOGLOBIN A1C: Hgb A1c MFr Bld: 5.9 % (ref 4.6–6.5)

## 2021-08-07 LAB — VITAMIN D 25 HYDROXY (VIT D DEFICIENCY, FRACTURES): VITD: 80.73 ng/mL (ref 30.00–100.00)

## 2021-08-07 LAB — TSH: TSH: 5.57 u[IU]/mL — ABNORMAL HIGH (ref 0.35–5.50)

## 2021-08-07 NOTE — Assessment & Plan Note (Signed)
Check A1c. 

## 2021-08-07 NOTE — Assessment & Plan Note (Signed)
Check lipids 

## 2021-08-07 NOTE — Patient Instructions (Signed)
It was very nice to see you today!  We will check blood work today.  Let me know If your pain does not continue to improve.  We gave you your flu shot today.  I will see you back in 1 year for your next physical.  Please come back to see me sooner if needed.  Take care, Dr Jerline Pain  PLEASE NOTE:  If you had any lab tests please let us know if you have not heard back within a few days. You may see your results on mychart before we have a chance to review them but we will give you a call once they are reviewed by [place]. If we ordered any referrals today, please let us know if you have not heard from their office within the next week.   Please try these tips to maintain a healthy lifestyle:  Eat at least 3 REAL meals and 1-2 snacks per day.  Aim for no more than 5 hours between eating.  If you eat breakfast, please do so within one hour of getting up.   Each meal should contain half fruits/vegetables, one quarter protein, and one quarter carbs (no bigger than a computer mouse)  Cut down on sweet beverages. This includes juice, soda, and sweet tea.   Drink at least 1 glass of water with each meal and aim for at least 8 glasses per day  Exercise at least 150 minutes every week.    Preventive Care 31-3 Years Old, Female Preventive care refers to lifestyle choices and visits with your health care provider that can promote health and wellness. This includes: A yearly physical exam. This is also called an annual wellness visit. Regular dental and eye exams. Immunizations. Screening for certain conditions. Healthy lifestyle choices, such as: Eating a healthy diet. Getting regular exercise. Not using drugs or products that contain nicotine and tobacco. Limiting alcohol use. What can I expect for my preventive care visit? Physical exam Your health care provider will check your: Height and weight. These may be used to calculate your BMI (body mass index). BMI is a measurement that tells if  you are at a healthy weight. Heart rate and blood pressure. Body temperature. Skin for abnormal spots. Counseling Your health care provider may ask you questions about your: Past medical problems. Family's medical history. Alcohol, tobacco, and drug use. Emotional well-being. Home life and relationship well-being. Sexual activity. Diet, exercise, and sleep habits. Work and work Statistician. Access to firearms. Method of birth control. Menstrual cycle. Pregnancy history. What immunizations do I need? Vaccines are usually given at various ages, according to a schedule. Your health care provider will recommend vaccines for you based on your age, medical history, and lifestyle or other factors, such as travel or where you work. What tests do I need? Blood tests Lipid and cholesterol levels. These may be checked every 5 years, or more often if you are over 8 years old. Hepatitis C test. Hepatitis B test. Screening Lung cancer screening. You may have this screening every year starting at age 30 if you have a 30-pack-year history of smoking and currently smoke or have quit within the past 15 years. Colorectal cancer screening. All adults should have this screening starting at age 30 and continuing until age 33. Your health care provider may recommend screening at age 24 if you are at increased risk. You will have tests every 1-10 years, depending on your results and the type of screening test. Diabetes screening. This is done by  checking your blood sugar (glucose) after you have not eaten for a while (fasting). You may have this done every 1-3 years. Mammogram. This may be done every 1-2 years. Talk with your health care provider about when you should start having regular mammograms. This may depend on whether you have a family history of breast cancer. BRCA-related cancer screening. This may be done if you have a family history of breast, ovarian, tubal, or peritoneal cancers. Pelvic  exam and Pap test. This may be done every 3 years starting at age 10. Starting at age 22, this may be done every 5 years if you have a Pap test in combination with an HPV test. Other tests STD (sexually transmitted disease) testing, if you are at risk. Bone density scan. This is done to screen for osteoporosis. You may have this scan if you are at high risk for osteoporosis. Talk with your health care provider about your test results, treatment options, and if necessary, the need for more tests. Follow these instructions at home: Eating and drinking  Eat a diet that includes fresh fruits and vegetables, whole grains, lean protein, and low-fat dairy products. Take vitamin and mineral supplements as recommended by your health care provider. Do not drink alcohol if: Your health care provider tells you not to drink. You are pregnant, may be pregnant, or are planning to become pregnant. If you drink alcohol: Limit how much you have to 0-1 drink a day. Be aware of how much alcohol is in your drink. In the [place] one drink equals one 12 oz bottle of beer (355 mL), one 5 oz glass of wine (148 mL), or one 1 oz glass of hard liquor (44 mL). Lifestyle Take daily care of your teeth and gums. Brush your teeth every morning and night with fluoride toothpaste. Floss one time each day. Stay active. Exercise for at least 30 minutes 5 or more days each week. Do not use any products that contain nicotine or tobacco, such as cigarettes, e-cigarettes, and chewing tobacco. If you need help quitting, ask your health care provider. Do not use drugs. If you are sexually active, practice safe sex. Use a condom or other form of protection to prevent STIs (sexually transmitted infections). If you do not wish to become pregnant, use a form of birth control. If you plan to become pregnant, see your health care provider for a prepregnancy visit. If told by your health care provider, take low-dose aspirin daily starting at  age 33. Find healthy ways to cope with stress, such as: Meditation, yoga, or listening to music. Journaling. Talking to a trusted person. Spending time with friends and family. Safety Always wear your seat belt while driving or riding in a vehicle. Do not drive: If you have been drinking alcohol. Do not ride with someone who has been drinking. When you are tired or distracted. While texting. Wear a helmet and other protective equipment during sports activities. If you have firearms in your house, make sure you follow all gun safety procedures. What's next? Visit your health care provider once a year for an annual wellness visit. Ask your health care provider how often you should have your eyes and teeth checked. Stay up to date on all vaccines. This information is not intended to replace advice given to you by your health care provider. Make sure you discuss any questions you have with your health care provider. Document Revised: 12/16/2020 Document Reviewed: 06/19/2018 Elsevier Patient Education  2022 [employer].

## 2021-08-07 NOTE — Assessment & Plan Note (Signed)
Check vit D. She is taking 50000IU twice weekly.

## 2021-08-07 NOTE — Progress Notes (Signed)
Chief Complaint:  Brandy [name] is a 60 y.o. female who presents today for her annual comprehensive physical exam.    Assessment/Plan:  New/Acute Problems: Breast Pain Improving. Likely muscular. Mammogram was negative. Will continue with watchful waiting and she will let me know if it does not continue to improve.   Chronic Problems Addressed Today: Hyperglycemia Check A1c.   Vitamin D deficiency Check vit D. She is taking 50000IU twice weekly.   Dyslipidemia Check lipids.    Body mass index is 26.5 kg/m. / Overweight    Preventative Healthcare: Check Labs. Flu shot given today. Needs screening mammogram next month. UTD on pap and colon cancer screening.   Patient Counseling(The following topics were reviewed and/or handout was given):  -Nutrition: Stressed importance of moderation in sodium/caffeine intake, saturated fat and cholesterol, caloric balance, sufficient intake of fresh fruits, vegetables, and fiber.  -Stressed the importance of regular exercise.   -Substance Abuse: Discussed cessation/primary prevention of tobacco, alcohol, or other drug use; driving or other dangerous activities under the influence; availability of treatment for abuse.   -Injury prevention: Discussed safety belts, safety helmets, smoke detector, smoking near bedding or upholstery.   -Sexuality: Discussed sexually transmitted diseases, partner selection, use of condoms, avoidance of unintended pregnancy and contraceptive alternatives.   -Dental health: Discussed importance of regular tooth brushing, flossing, and dental visits.  -Health maintenance and immunizations reviewed. Please refer to Health maintenance section.  Return to care in 1 year for next preventative visit.     Subjective:  HPI:  She has no acute complaints today.   The pain she noted experiencing at the last visit has since reduced significantly, stating that at worst it occurs sporadically.  She was wondering on  how much Vitamin D she can take, and if it was safe for her to take an additional form of Vitamin D supplement in conjunction with the 50k one that she takes twice per week.   Lifestyle Diet: Reasonably healthy diet but can improve  Exercise: She goes to the gym and walks ~10k steps per day.   Depression screen [facility] 2/9 07/04/2021  Decreased Interest 0  Down, Depressed, Hopeless 0  PHQ - 2 Score 0  Altered sleeping -  Tired, decreased energy -  Change in appetite -  Feeling bad or failure about yourself  -  Trouble concentrating -  Moving slowly or fidgety/restless -  Suicidal thoughts -  PHQ-9 Score -  Difficult doing work/chores -    Health Maintenance Due  Topic Date Due   Zoster Vaccines- Shingrix (1 of 2) Never done     ROS: Per HPI, otherwise a complete review of systems was negative.   PMH:  The following were reviewed and entered/updated in epic: No past medical history on file. Patient Active Problem List   Diagnosis Date Noted   Epigastric pain 03/08/2021   GERD (gastroesophageal reflux disease) 12/26/2020   Bilateral hand pain 12/26/2020   Osteoarthritis 08/01/2020   Vitamin D deficiency 07/13/2019   Hyperglycemia 07/13/2019   Dyslipidemia 10/08/2018   Elevated blood pressure reading 10/06/2018   Chronic pain of both knees 10/06/2018   Plantar fasciitis 10/06/2018   Past Surgical History:  Procedure Laterality Date   MYOMECTOMY     2007    Family History  Problem Relation Age of Onset   Parkinson's disease Mother    Hypertension Father    Diverticulitis Father    Heart attack Father    Colon cancer Neg Hx  Esophageal cancer Neg Hx    Pancreatic cancer Neg Hx    Stomach cancer Neg Hx    Inflammatory bowel disease Neg Hx    Liver disease Neg Hx    Rectal cancer Neg Hx     Medications- reviewed and updated Current Outpatient Medications  Medication Sig Dispense Refill   cholecalciferol (VITAMIN D3) 25 MCG (1000 UNIT) tablet Take 1,000  Units by mouth daily.     No current facility-administered medications for this visit.    Allergies-reviewed and updated No Known Allergies  Social History   Socioeconomic History   Marital status: Married    Spouse name: Not on file   Number of children: Not on file   Years of education: Not on file   Highest education level: Not on file  Occupational History   Not on file  Tobacco Use   Smoking status: Never   Smokeless tobacco: Never  Vaping Use   Vaping Use: Never used  Substance and Sexual Activity   Alcohol use: Not Currently   Drug use: Never   Sexual activity: Not on file  Other Topics Concern   Not on file  Social History Narrative   Not on file   Social Determinants of Health   Financial Resource Strain: Not on file  Food Insecurity: Not on file  Transportation Needs: Not on file  Physical Activity: Not on file  Stress: Not on file  Social Connections: Not on file        Objective:  Physical Exam: BP 136/83   Pulse 78   Temp 98.3 F (36.8 C)   Ht 5\' 1"  (1.549 m)   Wt 140 lb 4 oz (63.6 kg)   SpO2 99%   BMI 26.50 kg/m   Body mass index is 26.5 kg/m. Wt Readings from Last 3 Encounters:  08/07/21 140 lb 4 oz (63.6 kg)  07/04/21 138 lb 3.2 oz (62.7 kg)  04/13/21 139 lb (63 kg)   Gen: NAD, resting comfortably HEENT: TMs normal bilaterally. OP clear. No thyromegaly noted.  CV: RRR with no murmurs appreciated Pulm: NWOB, CTAB with no crackles, wheezes, or rhonchi GI: Normal bowel sounds present. Soft, Nontender, Nondistended. MSK: no edema, cyanosis, or clubbing noted Skin: warm, dry Neuro: CN2-12 grossly intact. Strength 5/5 in upper and lower extremities. Reflexes symmetric and intact bilaterally.  Psych: Normal affect and thought content     I,Jordan Kelly,acting as a scribe for 04/15/21, MD.,have documented all relevant documentation on the behalf of Brandy Doe, MD,as directed by  Brandy Doe, MD while in the presence of Brandy Doe, MD.  I, Brandy Doe, MD, have reviewed all documentation for this visit. The documentation on 08/07/21 for the exam, diagnosis, procedures, and orders are all accurate and complete.  08/09/21. Katina Degree, MD 08/07/2021 9:30 AM

## 2021-08-08 NOTE — Progress Notes (Signed)
Please inform patient of the following:  Her blood sugar and cholesterol are both borderline. Her numbers are a little better than last time. Do not need to start meds but she should continue working on diet and exercise and we can recheck in a year or so.  Her thryoid is slightly off. Recommend she come back to recheck TSH, Free t4, and free t3.  Everything else is NORMAL and we can recheck in a year.  Brandy [name]. [name], MD 08/08/2021 7:52 AM

## 2021-08-16 ENCOUNTER — Other Ambulatory Visit: Payer: Self-pay | Admitting: *Deleted

## 2021-08-16 DIAGNOSIS — E038 Other specified hypothyroidism: Secondary | ICD-10-CM

## 2021-08-17 ENCOUNTER — Other Ambulatory Visit (INDEPENDENT_AMBULATORY_CARE_PROVIDER_SITE_OTHER): Payer: BC Managed Care – PPO

## 2021-08-17 ENCOUNTER — Other Ambulatory Visit: Payer: Self-pay

## 2021-08-17 DIAGNOSIS — E038 Other specified hypothyroidism: Secondary | ICD-10-CM

## 2021-08-17 DIAGNOSIS — K219 Gastro-esophageal reflux disease without esophagitis: Secondary | ICD-10-CM

## 2021-08-17 LAB — T4, FREE: Free T4: 0.78 ng/dL (ref 0.60–1.60)

## 2021-08-17 LAB — TSH: TSH: 5 u[IU]/mL (ref 0.35–5.50)

## 2021-08-17 LAB — T3, FREE: T3, Free: 3.1 pg/mL (ref 2.3–4.2)

## 2021-08-18 NOTE — Progress Notes (Signed)
Please inform patient of the following:  Thyroid level is back to normal. We can recheck in a year.  Brandy [name]. Brandy Ralph, MD 08/18/2021 8:09 AM

## 2021-08-21 ENCOUNTER — Encounter: Payer: Self-pay | Admitting: Family Medicine

## 2021-08-23 NOTE — Telephone Encounter (Signed)
See results note. 

## 2021-08-30 ENCOUNTER — Other Ambulatory Visit: Payer: Self-pay | Admitting: Family Medicine

## 2021-08-30 DIAGNOSIS — Z1231 Encounter for screening mammogram for malignant neoplasm of breast: Secondary | ICD-10-CM

## 2021-09-08 ENCOUNTER — Other Ambulatory Visit: Payer: BC Managed Care – PPO

## 2021-09-08 DIAGNOSIS — K219 Gastro-esophageal reflux disease without esophagitis: Secondary | ICD-10-CM | POA: Diagnosis not present

## 2021-09-12 LAB — GI PROFILE, STOOL, PCR

## 2021-09-12 NOTE — Progress Notes (Signed)
Please inform patient of the following:  Her GI pathogen panel is normal.  Richerd Grime M. [name], MD 09/12/2021 3:12 PM

## 2021-09-15 ENCOUNTER — Encounter: Payer: Self-pay | Admitting: Family Medicine

## 2021-09-18 NOTE — Telephone Encounter (Signed)
Please schedule appointment with Dr Parker ?

## 2021-09-18 NOTE — Telephone Encounter (Signed)
Please advise 

## 2021-09-18 NOTE — Telephone Encounter (Signed)
Pt scheduled 1/26

## 2021-09-26 ENCOUNTER — Encounter: Payer: Self-pay | Admitting: Family Medicine

## 2021-09-26 ENCOUNTER — Other Ambulatory Visit: Payer: Self-pay

## 2021-09-26 ENCOUNTER — Ambulatory Visit: Payer: BC Managed Care – PPO | Admitting: Family Medicine

## 2021-09-26 VITALS — BP 121/80 | HR 74 | Temp 98.2°F | Ht 61.0 in | Wt 139.8 lb

## 2021-09-26 DIAGNOSIS — R0782 Intercostal pain: Secondary | ICD-10-CM | POA: Diagnosis not present

## 2021-09-26 DIAGNOSIS — R0789 Other chest pain: Secondary | ICD-10-CM | POA: Diagnosis not present

## 2021-09-26 DIAGNOSIS — M7711 Lateral epicondylitis, right elbow: Secondary | ICD-10-CM | POA: Diagnosis not present

## 2021-09-26 NOTE — Patient Instructions (Signed)
It was very nice to see you today!  You are having pain in the muscles in your chest.  We will get a CT scan which further is nothing else that is going on.  We may refer you to see physical therapy or sports medicine depending on results.  You have tennis elbow in your right elbow.  Please work on the exercises.  Let me know if not improving.  Take care, Dr [name]  PLEASE NOTE:  If you had any lab tests please let us know if you have not heard back within a few days. You may see your results on mychart before we have a chance to review them but we will give you a call once they are reviewed by [place]. If we ordered any referrals today, please let us know if you have not heard from their office within the next week.   Please try these tips to maintain a healthy lifestyle:  Eat at least 3 REAL meals and 1-2 snacks per day.  Aim for no more than 5 hours between eating.  If you eat breakfast, please do so within one hour of getting up.   Each meal should contain half fruits/vegetables, one quarter protein, and one quarter carbs (no bigger than a computer mouse)  Cut down on sweet beverages. This includes juice, soda, and sweet tea.   Drink at least 1 glass of water with each meal and aim for at least 8 glasses per day  Exercise at least 150 minutes every week.

## 2021-09-26 NOTE — Progress Notes (Signed)
   Brandy [name] is a 60 y.o. female who presents today for an office visit.  Assessment/Plan:  New/Acute Problems: Chest Wall Pain No red flags.  Seems to be mostly muscular however given that symptoms have been persistent for the last couple of months we will check a CT scan to rule out any other underlying issues.  No concern for cardiac or pulmonary involvement given that her symptoms are non exertional. Depending on results of CT scan will likely place referral to PT and sports medicine.  She has tried conservative management including a course of NSAIDs without significant improvement.  Lateral epicondylitis Discussed home exercises and counterforce bracing.  Handout was given.  She will let me know if not improving.      Subjective:  HPI:  She is here with right chest  pain. This started about 3 months ago.  We saw her about 2 months ago.  There was concern for breast pain at that point.  We started her on meloxicam and she did have some improvement with this.  We had a diagnostic mammogram and ultrasound which was reassuring.    Her breast pain seem to improve for several weeks however she has had recurrence of pain recently.  Her pain is now located under the armpit. comes and goes. She reports the pain get worse when she burp. She notes it was improving slightly. However she notes last week it started getting worse. She notes walking does not cause any pain. She rates pain 5/10 on a scale. Denies nausea or vomiting. Denies pain in breast or abdominal. No odynophagia.   She also complain of pain in her elbow. She notes pain get worse when she bend her arm. Denies swelling around the area. She plays pickle ball.  Worse with certain motions such as twisting her hand.       Objective:  Physical Exam: BP 121/80 (BP Location: Right Arm)   Pulse 74   Temp 98.2 F (36.8 C) (Temporal)   Ht 5\' 1"  (1.549 m)   Wt 139 lb 12.8 oz (63.4 kg)   SpO2 99%   BMI 26.41 kg/m   Gen: No  acute distress, resting comfortably CV: Regular rate and rhythm with no murmurs appreciated Pulm: Normal work of breathing, clear to auscultation bilaterally with no crackles, wheezes, or rhonchi MSK: - chest: Chaperone present for exam.  Area of tenderness on right lateral chest wall.  No obvious deformities.  No obvious masses.  Pain is reproducible on exam - Right elbow: Tenderness to palpation lateral epicondyle.  Pain elicited with resisted extension of the digit. Neuro: Grossly normal, moves all extremities Psych: Normal affect and thought content       I,Savera Zaman,acting as a scribe for , MD.,have documented all relevant documentation on the behalf of Jacquiline Doe, MD,as directed by  Jacquiline Doe, MD while in the presence of Jacquiline Doe, MD.   I, Jacquiline Doe, MD, have reviewed all documentation for this visit. The documentation on 09/26/21 for the exam, diagnosis, procedures, and orders are all accurate and complete.  Time Spent: 30 minutes of total time was spent on the date of the encounter performing the following actions: chart review prior to seeing the patient, obtaining history, performing a medically necessary exam, counseling on the treatment plan including imaging and home exercises, placing orders, and documenting in our EHR.     14/06/22. Katina Degree, MD 09/26/2021 8:50 AM

## 2021-10-03 ENCOUNTER — Telehealth: Payer: Self-pay

## 2021-10-03 ENCOUNTER — Other Ambulatory Visit: Payer: Self-pay

## 2021-10-03 ENCOUNTER — Ambulatory Visit
Admission: RE | Admit: 2021-10-03 | Discharge: 2021-10-03 | Disposition: A | Discharge status: Home / Self Care | Payer: BC Managed Care – PPO | Source: Ambulatory Visit | Attending: Family Medicine | Admitting: Family Medicine

## 2021-10-03 ENCOUNTER — Encounter: Payer: Self-pay | Admitting: *Deleted

## 2021-10-03 ENCOUNTER — Other Ambulatory Visit: Payer: Self-pay | Admitting: *Deleted

## 2021-10-03 DIAGNOSIS — Z1231 Encounter for screening mammogram for malignant neoplasm of breast: Secondary | ICD-10-CM

## 2021-10-03 DIAGNOSIS — R0789 Other chest pain: Secondary | ICD-10-CM

## 2021-10-03 NOTE — Telephone Encounter (Signed)
Xray order  Patient notified

## 2021-10-03 NOTE — Telephone Encounter (Signed)
Patient called in about her CT talked to [facility] and stated that it was sent as a Xray instead and ordered needs to be changed.

## 2021-10-11 ENCOUNTER — Ambulatory Visit (INDEPENDENT_AMBULATORY_CARE_PROVIDER_SITE_OTHER)
Admission: RE | Admit: 2021-10-11 | Discharge: 2021-10-11 | Disposition: A | Discharge status: Home / Self Care | Payer: BC Managed Care – PPO | Source: Ambulatory Visit | Attending: Family Medicine | Admitting: Family Medicine

## 2021-10-11 ENCOUNTER — Other Ambulatory Visit: Payer: Self-pay

## 2021-10-11 DIAGNOSIS — R1011 Right upper quadrant pain: Secondary | ICD-10-CM | POA: Diagnosis not present

## 2021-10-11 DIAGNOSIS — R0789 Other chest pain: Secondary | ICD-10-CM | POA: Diagnosis not present

## 2021-10-18 NOTE — Progress Notes (Signed)
Please inform patient of the following:  Her xray is NORMAL. If her symptoms are not improving would recommend CT scan as next step.  Katina Degree. [name], MD 10/18/2021 9:32 AM

## 2022-01-04 ENCOUNTER — Telehealth: Payer: Self-pay | Admitting: Family Medicine

## 2022-01-04 NOTE — Telephone Encounter (Signed)
Kristen from [facility] called stating pts insurance denied GPP test from 09/08/21- Insurance stated it is not medical necessity. Stated code billed: K21.9. Baxter Hire will fax over documentation for Dr [name] to review.-  ?

## 2022-01-04 NOTE — Telephone Encounter (Signed)
FYI

## 2022-03-01 ENCOUNTER — Encounter: Payer: Self-pay | Admitting: Gastroenterology

## 2022-03-06 ENCOUNTER — Encounter: Payer: Self-pay | Admitting: Family Medicine

## 2022-03-06 NOTE — Telephone Encounter (Signed)
Patient called in stating she was having chest pain.  I have transferred to [facility].  Did not see this message until after.

## 2022-03-06 NOTE — Telephone Encounter (Signed)
Triage sent patient back around.  Patient could only come on Friday.  She is scheduled to see [name] 6/19 at 9am.

## 2022-03-07 ENCOUNTER — Ambulatory Visit: Payer: BC Managed Care – PPO | Admitting: Family

## 2022-03-07 ENCOUNTER — Telehealth: Payer: Self-pay | Admitting: Family Medicine

## 2022-03-07 NOTE — Telephone Encounter (Signed)
FYI

## 2022-03-07 NOTE — Telephone Encounter (Signed)
I am ok to see her then but recommend sooner evaluation if she is able. ? ?Katina Degree. [name], MD ?03/07/2022 8:05 AM  ? ?

## 2022-03-07 NOTE — Telephone Encounter (Signed)
FYI see note

## 2022-03-07 NOTE — Telephone Encounter (Signed)
Pt refused to call EMS  ? ?Patient ?Name: ?Brandy [name] ?ELCASTILL ?O ?Gender: Female ?DOB: 1961/09/24 ?Age: 61 Y 5 M 22 [name] ?Return ?Phone ?Number: ?[id number] ?(Primary) ?Address: ?City/ ?State/ ?Zip: ?[place] ? [address] ?[facility] - ?Client ?Health visitor at [facility] ?Provider Jacquiline Doe- MD ?Contact Type Call ?Who Is Calling Patient / Member / Family / Caregiver ?Call Type Triage / Clinical ?Relationship To Patient Self ?Return Phone Number [phone] (Primary) ?Chief Complaint CHEST PAIN - pain, pressure, heaviness or ?tightness ?Reason for Call Symptomatic / Request for Health Information ?Initial Comment Caller states she is calling because she is having ?chest and arm pain ?Translation No ?Nurse Assessment ?Nurse: Yetta Barre, RN, Rene Kocher Date/Time (Eastern Time): 03/06/2022 5:04:08 PM ?Confirm and document reason for call. If ?symptomatic, describe symptoms. ?---Caller states she is calling because she is having ?chest and arm pain. ?Does the patient have any new or worsening ?symptoms? ---Yes ?Will a triage be completed? ---Yes ?Related visit to physician within the last 2 weeks? ---No ?Does the PT have any chronic conditions? (i.e. ?diabetes, asthma, this includes High risk factors for ?pregnancy, etc.) ?---No ?Is this a behavioral health or substance abuse call? ---No ?Guidelines ?Guideline Title Affirmed Question Affirmed Notes Nurse Date/Time (Eastern ?Time) ?Chest Pain [1] Chest pain lasts > ?5 minutes AND [2] ?age > 2 ?Yetta Barre, RN, [facility] 03/06/2022 5:05:20 ?PM ?Disp. Time (Eastern ?Time) Disposition Final User ?03/06/2022 5:03:08 PM Send to Urgent Queue Jake Seats ?03/06/2022 5:13:32 PM 911 Outcome Documentation Yetta Barre, RN, Rene Kocher ?Reason: Caller is not calling 911. ?03/06/2022 5:09:48 PM Call EMS 911 Now Yes Yetta Barre, RN, Rene Kocher ?Caller Disagree/Comply Disagree ?Caller Understands Yes ?PreDisposition Call Doctor ?Care Advice Given Per  Guideline ?CALL EMS 911 NOW: * Immediate medical attention is needed. You need to hang up and call 911 (or an ambulance). * Triager ?Discretion: I'll call you back in a few minutes to be sure you were able to reach them. * Call EMS 911 first. ?Comments ?User: [name], RN Date/Time Lamount Cohen Time): 03/06/2022 5:14:09 PM ?Transferred caller to the backline to schedule an appointment. Caller states she is not calling 911. ?

## 2022-03-09 ENCOUNTER — Ambulatory Visit: Payer: BC Managed Care – PPO | Admitting: Family Medicine

## 2022-03-09 ENCOUNTER — Encounter: Payer: Self-pay | Admitting: Family Medicine

## 2022-03-09 VITALS — BP 126/82 | HR 59 | Temp 98.1°F | Ht 61.0 in | Wt 141.8 lb

## 2022-03-09 DIAGNOSIS — M25511 Pain in right shoulder: Secondary | ICD-10-CM

## 2022-03-09 DIAGNOSIS — S29011D Strain of muscle and tendon of front wall of thorax, subsequent encounter: Secondary | ICD-10-CM | POA: Diagnosis not present

## 2022-03-09 MED ORDER — MELOXICAM 15 MG PO TABS: 15.0000 mg | ORAL_TABLET | Freq: Every day | ORAL | 0 refills | Status: DC

## 2022-03-09 NOTE — Patient Instructions (Signed)
It was very nice to see you today!  Please start the meloxicam.  Take every day for the next 1 to 2 weeks.  We will also refer you to see sports medicine for them to do further evaluation.  Take care, Dr Jerline Pain  PLEASE NOTE:  If you had any lab tests please let us know if you have not heard back within a few days. You may see your results on mychart before we have a chance to review them but we will give you a call once they are reviewed by [place]. If we ordered any referrals today, please let us know if you have not heard from their office within the next week.   Please try these tips to maintain a healthy lifestyle:  Eat at least 3 REAL meals and 1-2 snacks per day.  Aim for no more than 5 hours between eating.  If you eat breakfast, please do so within one hour of getting up.   Each meal should contain half fruits/vegetables, one quarter protein, and one quarter carbs (no bigger than a computer mouse)  Cut down on sweet beverages. This includes juice, soda, and sweet tea.   Drink at least 1 glass of water with each meal and aim for at least 8 glasses per day  Exercise at least 150 minutes every week.

## 2022-03-09 NOTE — Progress Notes (Signed)
   Brandy [name] is a 61 y.o. female who presents today for an office visit.  Assessment/Plan:  Right Arm/Shoulder Pain and Right Chest Wall Pain Consistent with muscular strain or some other musculoskeletal etiology.  Her pain is reproducible on exam.  History not consistent with cardiac etiology and recent mammogram and chest x-ray were normal.  She may have some underlying osteoarthritis in her right shoulder as well.  She has had some improvement with meloxicam however symptoms are still persisting.  She may have an underlying pectoralis strain as well.  We discussed further imaging versus referral to sports medicine.  She wishes to be referred to sports medicine at this point.  We will refill her meloxicam and place referral today.  We discussed reasons to return to care.    Subjective:  HPI:  Patient here with right arm/shoulder and chest wall pain for the last 9 months.  Started after playing pickle ball initially.  We have seen her a few times in this office since then.  Her mammogram and chest x-ray were both negative for any abnormalities.  We tried her meloxicam and she did have some improvement however she has had intermittent recurrences for the last several months once she is off medication.  She went to [place] for a couple of months and received "electrical treatments" which seemed to help some.  She was recently had a recurrence.  The pain in her arm or shoulder seems to be improving but is still persistent.  Still has pain in her right chest wall.  Worse with certain motions.  Worse when pressing on the area.  She does not have any exertional symptoms.  No shortness of breath.  No substernal chest pain.         Objective:  Physical Exam: BP 126/82 (BP Location: Left Arm)   Pulse (!) 59   Temp 98.1 F (36.7 C) (Temporal)   Ht 5\' 1"  (1.549 m)   Wt 141 lb 12.8 oz (64.3 kg)   SpO2 98%   BMI 26.79 kg/m   Gen: No acute distress, resting comfortably CV: Regular rate and  rhythm with no murmurs appreciated Pulm: Normal work of breathing, clear to auscultation bilaterally with no crackles, wheezes, or rhonchi MSK:  - Right Arm: No deformities.  Tenderness to palpation along anterior shoulder.  Neurovascular intact distally - Chest wall: No deformities.  Tenderness palpation along right lateral chest wall. Neuro: Grossly normal, moves all extremities Psych: Normal affect and thought content      Vedant Shehadeh M. , MD 03/09/2022 9:32 AM

## 2022-03-15 NOTE — Progress Notes (Signed)
Subjective:    CC: R shoulder and chest pain  I, Molly Weber, LAT, ATC, am serving as scribe for Dr. Clementeen Graham.  HPI: Pt is a 61 y/o female presenting w/ c/o R-sided chest/axilla pain x 9 months (Sept 2022) that initially began after picking up/playing pickle ball.  She was most recently seen by her PCP on 03/09/22 w/ c/o con't pain/recurrence.  She locates her pain to her R axilla/lateral breast.  She did also have R shoulder pain but that has resolved w/ using meloxicam.   The pain does not occur with sports activity or with normal shoulder motion.  She denies any swelling or bumps in the area.  She only notes the pain occurs when she burps.  Radiating pain: no Aggravating factors: burping Treatments tried:  Meloxicam  Diagnostic testing: Chest XR- 10/11/21; Mammogram- 10/03/21  Pertinent review of Systems: No fevers or chills  Relevant historical information: GERD   Objective:    Vitals:   03/16/22 0929  BP: 140/82  Pulse: 74  SpO2: 96%   General: Well Developed, well nourished, and in no acute distress.   MSK: Right chest wall: Normal appearing Nontender.  No skin No masses palpated in the axilla or right lateral chest wall. Normal shoulder motion. Unable to reproduce pain with MSK activity in clinic today.  Lab and Radiology Results X-ray images right ribs and chest obtained today personally and independently interpreted No acute fractures.  Normal-appearing ribs and chest wall on x-ray.  No intrathoracic abnormality visible. Await formal radiology review  EXAM: CHEST - 2 VIEW   COMPARISON:  None   FINDINGS: Normal heart size, mediastinal contours, and pulmonary vascularity.   Lungs clear.   No pleural effusion or pneumothorax.   Bones unremarkable.   IMPRESSION: Normal exam.     Electronically Signed   By: Ulyses Southward M.D.   On: 10/12/2021 09:46 I, Clementeen Graham, personally    Impression and Recommendations:    Assessment and  Plan: 61 y.o. female with right axillary pain ongoing for about a year.  Pain occurs with burping now.  She denies much pain with activity.  Etiology is unclear to me at this time.  We discussed options.  Plan for limited trial of physical therapy.  Is possible that with physical therapy activity we can narrow down or lower elicit or perhaps treat the pain. Additionally we will do a limited trial of omeprazole as burping could indicate this is a GI related issue. Recheck in 6 weeks.Marland Kitchen  PDMP not reviewed this encounter. Orders Placed This Encounter  Procedures   DG Ribs Unilateral W/Chest Right    Standing Status:   Future    Number of Occurrences:   1    Standing Expiration Date:   04/16/2022    Order Specific Question:   Reason for Exam (SYMPTOM  OR DIAGNOSIS REQUIRED)    Answer:   R axillary pain    Order Specific Question:   Is patient pregnant?    Answer:   No    Order Specific Question:   Preferred imaging location?    Answer:   [facility]   Ambulatory referral to Physical Therapy    Referral Priority:   Routine    Referral Type:   Physical Medicine    Referral Reason:   Specialty Services Required    Requested Specialty:   Physical Therapy    Number of Visits Requested:   1   Meds ordered this encounter  Medications   omeprazole (PRILOSEC) 40 MG capsule    Sig: Take 1 capsule (40 mg total) by mouth daily.    Dispense:  30 capsule    Refill:  1    Discussed warning signs or symptoms. Please see discharge instructions. Patient expresses understanding.   The above documentation has been reviewed and is accurate and complete Clementeen Graham, M.D.

## 2022-03-16 ENCOUNTER — Encounter: Payer: Self-pay | Admitting: Family Medicine

## 2022-03-16 ENCOUNTER — Ambulatory Visit (INDEPENDENT_AMBULATORY_CARE_PROVIDER_SITE_OTHER): Payer: BC Managed Care – PPO

## 2022-03-16 ENCOUNTER — Ambulatory Visit (INDEPENDENT_AMBULATORY_CARE_PROVIDER_SITE_OTHER): Payer: BC Managed Care – PPO | Admitting: Family Medicine

## 2022-03-16 VITALS — BP 140/82 | HR 74 | Ht 61.0 in | Wt 142.6 lb

## 2022-03-16 DIAGNOSIS — R0781 Pleurodynia: Secondary | ICD-10-CM | POA: Diagnosis not present

## 2022-03-16 DIAGNOSIS — M79621 Pain in right upper arm: Secondary | ICD-10-CM | POA: Diagnosis not present

## 2022-03-16 MED ORDER — OMEPRAZOLE 40 MG PO CPDR: 40.0000 mg | DELAYED_RELEASE_CAPSULE | Freq: Every day | ORAL | 1 refills | Status: DC

## 2022-03-16 NOTE — Patient Instructions (Addendum)
Nice to meet you today. ? ?I've referred you to Physical Therapy.  Their office will call you to schedule but please let us know if you haven't heard from them in one week regarding scheduling. ? ?Please get an Xray today before you leave. ? ?Follow-up: 6 weeks.  ? ?

## 2022-03-20 NOTE — Progress Notes (Signed)
Rib x-ray shows no fracture right ribs.  Lungs look normal to radiology.

## 2022-03-30 ENCOUNTER — Ambulatory Visit (INDEPENDENT_AMBULATORY_CARE_PROVIDER_SITE_OTHER): Payer: BC Managed Care – PPO | Admitting: Physical Therapy

## 2022-03-30 ENCOUNTER — Encounter: Payer: Self-pay | Admitting: Physical Therapy

## 2022-03-30 DIAGNOSIS — M25511 Pain in right shoulder: Secondary | ICD-10-CM

## 2022-03-30 DIAGNOSIS — M62838 Other muscle spasm: Secondary | ICD-10-CM

## 2022-03-30 NOTE — Therapy (Signed)
OUTPATIENT PHYSICAL THERAPY SHOULDER EVALUATION   Patient Name: Brandy [name] MRN: [id number] DOB:08-14-1961, 61 y.o., female Today's Date: 03/30/2022   PT End of Session - 03/30/22 1009     Visit Number 1    Number of Visits 12    Date for PT Re-Evaluation 05/11/22    Authorization Type [employer]    PT Start Time 1012    PT Stop Time 1050    PT Time Calculation (min) 38 min    Activity Tolerance Patient tolerated treatment well    Behavior During Therapy [facility] for tasks assessed/performed             History reviewed. No pertinent past medical history. Past Surgical History:  Procedure Laterality Date   MYOMECTOMY     2007   Patient Active Problem List   Diagnosis Date Noted   Epigastric pain 03/08/2021   GERD (gastroesophageal reflux disease) 12/26/2020   Bilateral hand pain 12/26/2020   Osteoarthritis 08/01/2020   Vitamin D deficiency 07/13/2019   Hyperglycemia 07/13/2019   Dyslipidemia 10/08/2018   Elevated blood pressure reading 10/06/2018   Chronic pain of both knees 10/06/2018   Plantar fasciitis 10/06/2018    PCP: Jacquiline Doe  REFERRING PROVIDER: Clementeen Graham   REFERRING DIAG: R axilla pain   THERAPY DIAG:  Acute pain of right shoulder  Other muscle spasm  Rationale for Evaluation and Treatment Rehabilitation  ONSET DATE: September   SUBJECTIVE:                                                                                                                                                                                      SUBJECTIVE STATEMENT:   Pain since September. Notes pain most with Burp, hiccups, not coughing. States no pain with exercise, yoga, or work. [place] at [employer] [place].  Feels pain is deep. Has been exercising, but has not had formal treatment.   PERTINENT HISTORY: None   PAIN:  Are you having pain? Yes: NPRS scale: 4/10 Pain location: R axilla  Pain description: intermittent, for a few seconds, then  goes away. Sore  Aggravating factors: burping,  Relieving factors: none stated   PRECAUTIONS: None  WEIGHT BEARING RESTRICTIONS No  FALLS:  Has patient fallen in last 6 months? No   PLOF: Independent  PATIENT GOALS  decreased pain   OBJECTIVE: Eval:   DIAGNOSTIC FINDINGS:   COGNITION:  Overall cognitive status: Within functional limits for tasks assessed    UPPER EXTREMITY ROM:    Cervical and UE ROM: WNL. Mild soreness in R shoulder with end range ER/Passive.    UPPER EXTREMITY MMT:   Shoulder: 4+/5  SHOULDER SPECIAL TESTS: Neg   JOINT MOBILITY TESTING:  WNL  PALPATION:  Tenderness in R lateral chest, chest wall, lateral to breast tissue and distal to axilla. Minimal pain in lat, or pec. Mild discomfort in subscap.    TODAY'S TREATMENT:  Ther ex: see below for HEP Manual: STM to axilla, lateral chest.    PATIENT EDUCATION: Education details: PT POC, Exam findings, HEP Person educated: Patient Education method: Explanation, Demonstration, Tactile cues, Verbal cues, and Handouts Education comprehension: verbalized understanding, returned demonstration, verbal cues required, tactile cues required, and needs further education   HOME EXERCISE PROGRAM: Access Code: [email or web address] URL: [email or web address] Date: 03/30/2022 Prepared by: Sedalia Muta  Exercises - Standing 'L' Stretch at Counter  - 2 x daily - 3 reps - 30 hold - Supine Shoulder Flexion Extension AAROM with Dowel  - 1 x daily - 1 sets - 10 reps - 10 hold - Doorway Pec Stretch at 90 Degrees Abduction  - 2 x daily - 3 reps - 30 hold  ASSESSMENT:  CLINICAL IMPRESSION: Patient presents with primary complain of increased pain in R axilla region. She does have tenderness today, in lateral chest, lateral to breast and distal to axilla. Trial for PT will be helpful for attempts for pain relief. Manual tissue release done today, as well as ther ex for stretching of pec, and lat. Pt with  good ability for functional activity, with minimal pain, but does have ongoing pain and will benefit from trial of skilled PT.    OBJECTIVE IMPAIRMENTS decreased mobility, increased fascial restrictions, increased muscle spasms, and pain.   ACTIVITY LIMITATIONS lifting and reach over head  PARTICIPATION LIMITATIONS:  none  PERSONAL FACTORS  none   are also affecting patient's functional outcome.   REHAB POTENTIAL: Good  CLINICAL DECISION MAKING: Stable/uncomplicated  EVALUATION COMPLEXITY: Low   GOALS: Goals reviewed with patient? Yes  SHORT TERM GOALS: Target date: 04/13/2022     1. Pt to be independent with initial HEP Goal status: INITIAL  2.  Pt to report tenderness to palpation of R axilla region to be improved by at least 50%  Goal status: INITIAL    LONG TERM GOALS: Target date: 05/11/2022     Pt to be independent with final HEP  Goal status: INITIAL  2.  Pt to report decreased pain in R axilla region to 0-2/10 with activity, deep breaths, and holding breath, bearing down  Goal status: INITIAL  3.  Pt to demo soft tissue limitation, and pain with palpation to axilla region to be wnl.   Goal status: INITIAL     PLAN: PT FREQUENCY: 1-2x/week  PT DURATION: 6 weeks  PLANNED INTERVENTIONS: Therapeutic exercises, Therapeutic activity, Neuromuscular re-education, Patient/Family education, Joint manipulation, Joint mobilization, DME instructions, Dry Needling, Electrical stimulation, Spinal manipulation, Spinal mobilization, Cryotherapy, Moist heat, Taping, Traction, Ultrasound, Ionotophoresis 4mg /ml Dexamethasone, and Manual therapy  PLAN FOR NEXT SESSION:    , PT, DPT 10:57 AM  03/30/22

## 2022-04-08 ENCOUNTER — Other Ambulatory Visit: Payer: Self-pay | Admitting: Family Medicine

## 2022-04-13 ENCOUNTER — Encounter: Payer: BC Managed Care – PPO | Admitting: Physical Therapy

## 2022-04-20 ENCOUNTER — Encounter: Payer: BC Managed Care – PPO | Admitting: Physical Therapy

## 2022-04-30 IMAGING — MG MM DIGITAL DIAGNOSTIC UNILAT*R* W/ TOMO W/ CAD
4 series · 4 of 12 positions shown · non-contrast
Comparison: Previous exam(s).

CLINICAL DATA: Focal pain in the right breast.

EXAM:
DIGITAL DIAGNOSTIC UNILATERAL RIGHT MAMMOGRAM WITH TOMOSYNTHESIS AND
CAD; ULTRASOUND RIGHT BREAST LIMITED
TECHNIQUE: Right digital diagnostic mammography and breast tomosynthesis was
performed. The images were evaluated with computer-aided detection.;
Targeted ultrasound examination of the right breast was performed

[R MLO synth-2D]
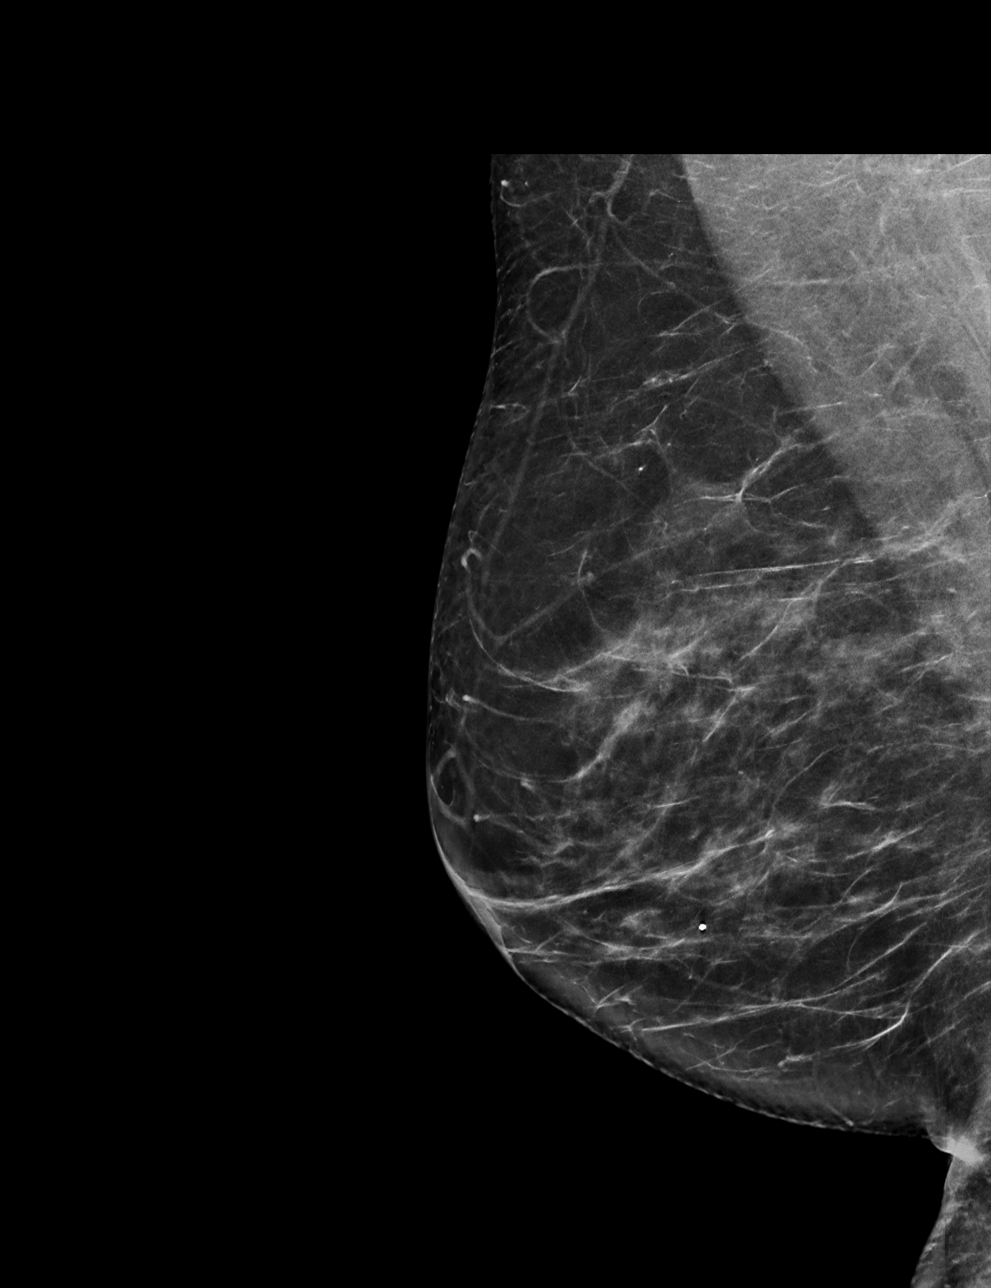

[R CC synth-2D]
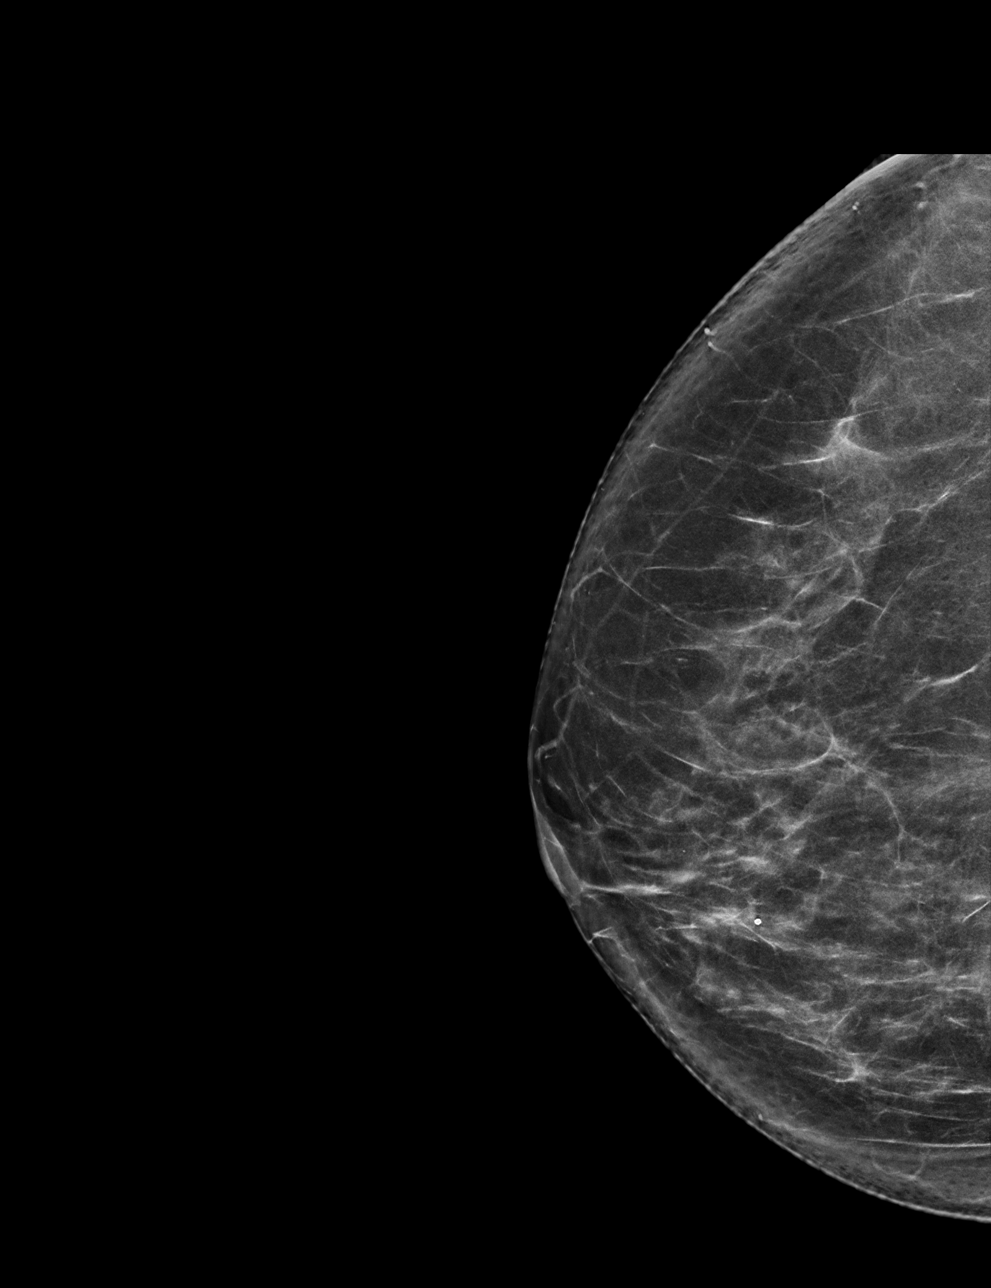

[R CC tomo · tomo slice 36/71.0]
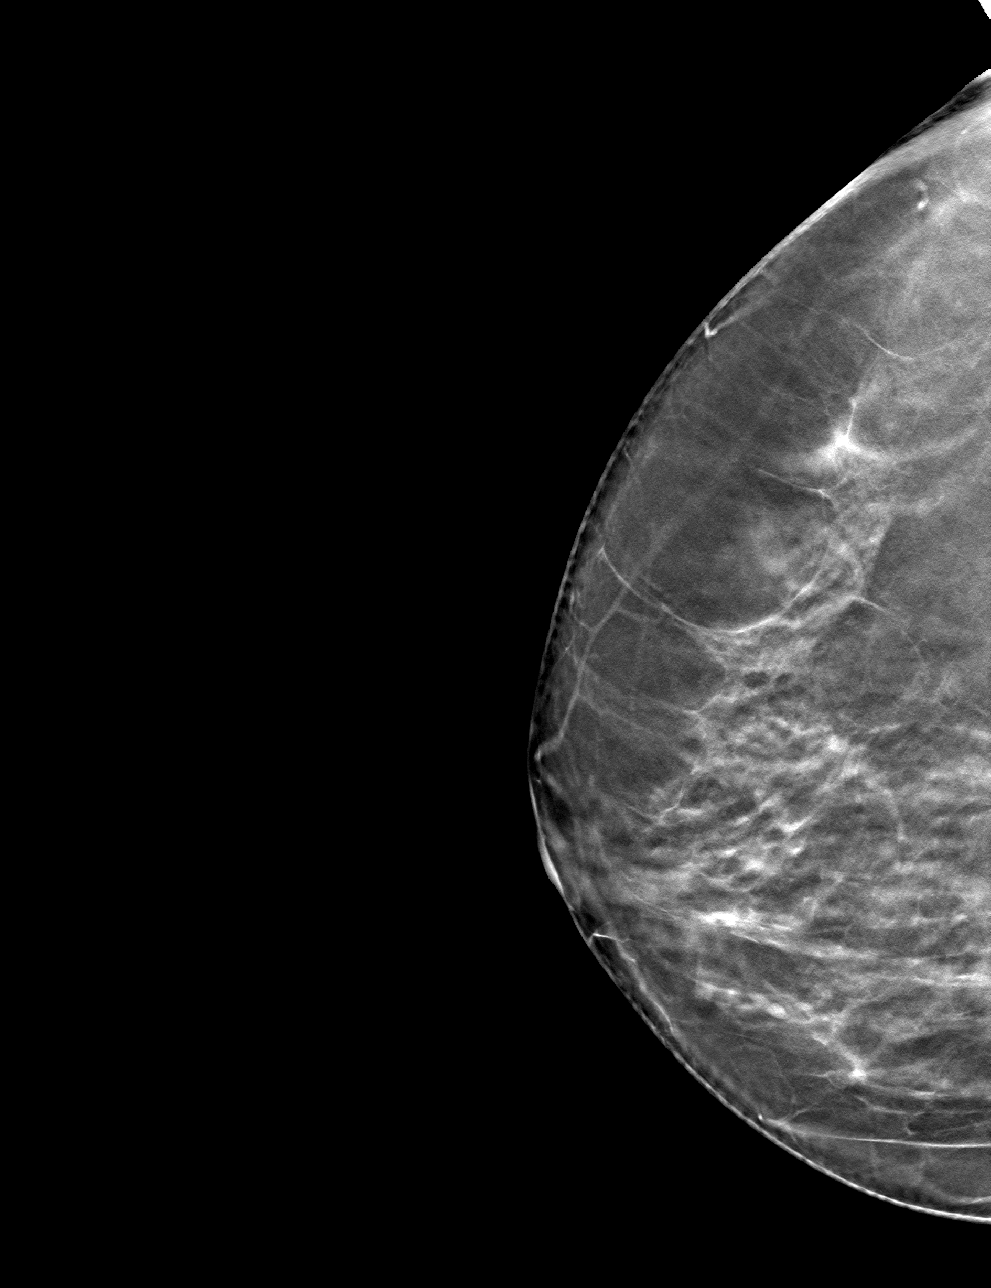

[R MLO tomo · tomo slice 37/73.0]
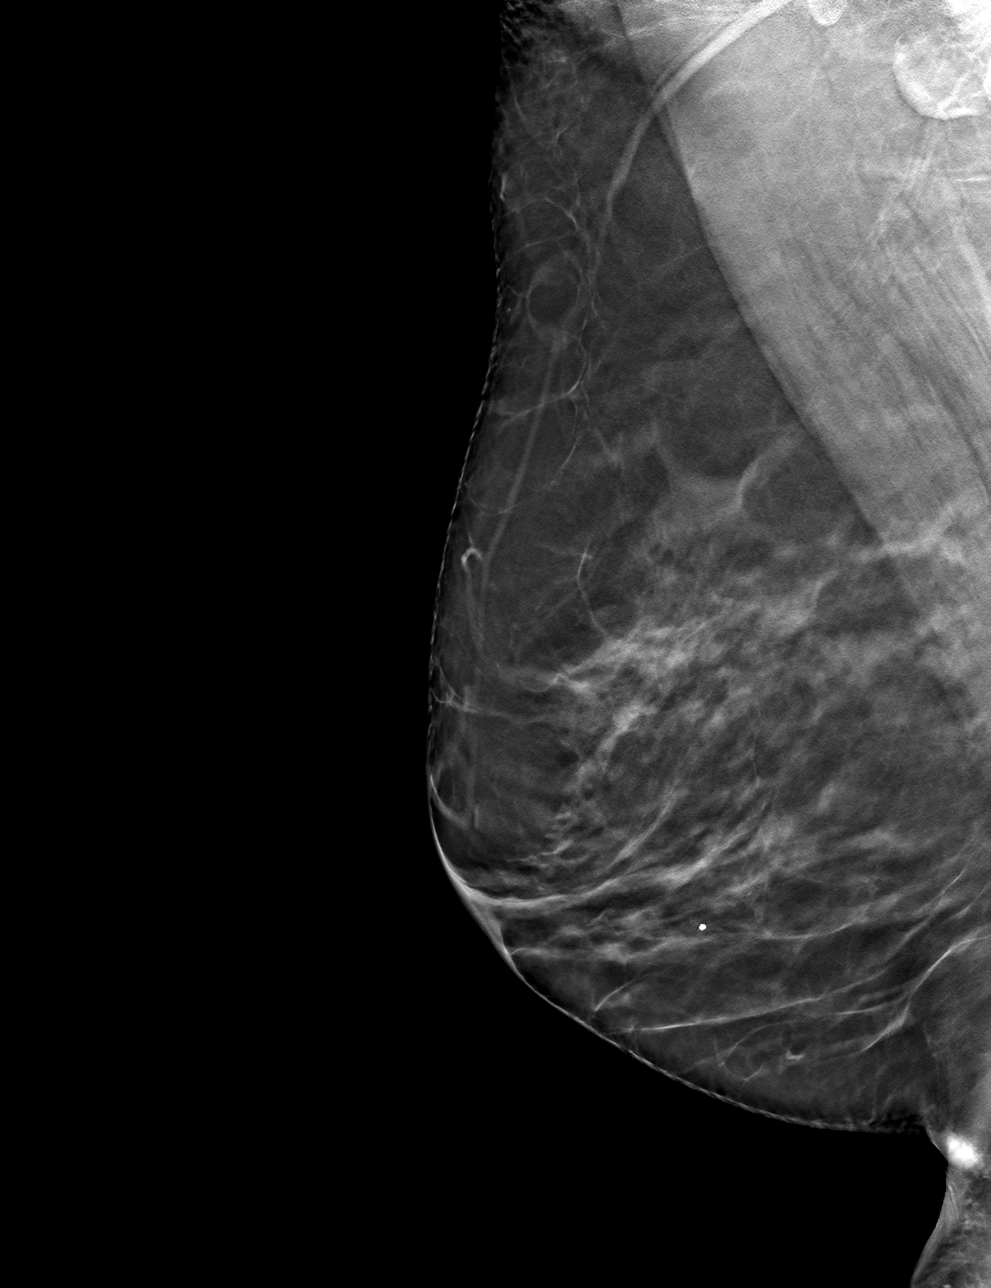

[4 of 12 positions shown; findings below may reference images not displayed]

ACR Breast Density Category b: There are scattered areas of
fibroglandular density.
FINDINGS: No suspicious masses, calcifications, or distortion identified.

On physical exam, no suspicious lumps are identified.

Targeted ultrasound is performed, showing no cause for focal pain.
IMPRESSION: No mammographic or sonographic evidence of malignancy. No cause for
right focal pain identified.

RECOMMENDATION:
Treatment of the patient's symptoms should be based on clinical and
physical exam given lack of imaging findings. Recommend annual
screening mammography.

I have discussed the findings and recommendations with the patient.
If applicable, a reminder letter will be sent to the patient
regarding the next appointment.

BI-RADS CATEGORY  1: Negative.

## 2022-04-30 IMAGING — US US BREAST*R* LIMITED INC AXILLA
1 series · 1 of 1 positions shown · non-contrast
Comparison: Previous exam(s).

CLINICAL DATA: Focal pain in the right breast.

EXAM:
DIGITAL DIAGNOSTIC UNILATERAL RIGHT MAMMOGRAM WITH TOMOSYNTHESIS AND
CAD; ULTRASOUND RIGHT BREAST LIMITED
TECHNIQUE: Right digital diagnostic mammography and breast tomosynthesis was
performed. The images were evaluated with computer-aided detection.;
Targeted ultrasound examination of the right breast was performed

[Series 1: us breast*right* limited inc axilla · 0.07mm/px · 1 of 1 slices shown]
[im 1/1]
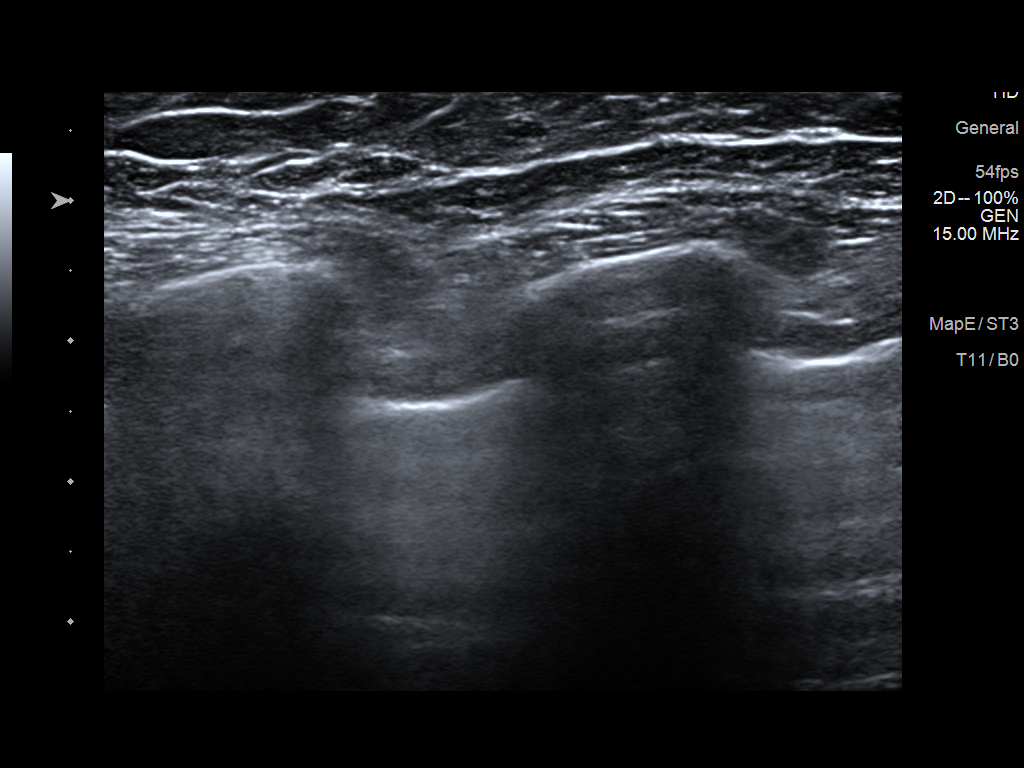

[1 of 1 positions shown; findings below may reference images not displayed]

ACR Breast Density Category b: There are scattered areas of
fibroglandular density.
FINDINGS: No suspicious masses, calcifications, or distortion identified.

On physical exam, no suspicious lumps are identified.

Targeted ultrasound is performed, showing no cause for focal pain.
IMPRESSION: No mammographic or sonographic evidence of malignancy. No cause for
right focal pain identified.

RECOMMENDATION:
Treatment of the patient's symptoms should be based on clinical and
physical exam given lack of imaging findings. Recommend annual
screening mammography.

I have discussed the findings and recommendations with the patient.
If applicable, a reminder letter will be sent to the patient
regarding the next appointment.

BI-RADS CATEGORY  1: Negative.

## 2022-05-04 ENCOUNTER — Encounter: Payer: BC Managed Care – PPO | Admitting: Physical Therapy

## 2022-06-08 ENCOUNTER — Ambulatory Visit: Payer: BC Managed Care – PPO | Admitting: Family Medicine

## 2022-06-21 ENCOUNTER — Other Ambulatory Visit: Payer: Self-pay | Admitting: Family Medicine

## 2022-06-21 DIAGNOSIS — Z1231 Encounter for screening mammogram for malignant neoplasm of breast: Secondary | ICD-10-CM

## 2022-07-11 ENCOUNTER — Telehealth: Payer: Self-pay | Admitting: Family Medicine

## 2022-07-11 NOTE — Telephone Encounter (Signed)
Pt states: -Saw physical therapist in June 2023 for Eval. -Calling to set up therapy.   Please advise the next steps.

## 2022-07-16 ENCOUNTER — Encounter: Payer: Self-pay | Admitting: *Deleted

## 2022-07-20 NOTE — Telephone Encounter (Signed)
Routing to clinical lead for any needed follow up per instructions from [place]. Admin.

## 2022-07-30 ENCOUNTER — Ambulatory Visit (INDEPENDENT_AMBULATORY_CARE_PROVIDER_SITE_OTHER): Payer: BC Managed Care – PPO | Admitting: Physical Therapy

## 2022-07-30 DIAGNOSIS — M25511 Pain in right shoulder: Secondary | ICD-10-CM | POA: Diagnosis not present

## 2022-07-30 DIAGNOSIS — M62838 Other muscle spasm: Secondary | ICD-10-CM

## 2022-07-30 NOTE — Therapy (Addendum)
OUTPATIENT PHYSICAL THERAPY SHOULDER TREATMENT/ Re-Cert   Patient Name: Brandy [name] MRN: [id number] DOB:[birth year], 61 y.o., female Today's Date: 07/30/2022     No past medical history on file. Past Surgical History:  Procedure Laterality Date   MYOMECTOMY     2007   Patient Active Problem List   Diagnosis Date Noted   Epigastric pain 03/08/2021   GERD (gastroesophageal reflux disease) 12/26/2020   Bilateral hand pain 12/26/2020   Osteoarthritis 08/01/2020   Vitamin D deficiency 07/13/2019   Hyperglycemia 07/13/2019   Dyslipidemia 10/08/2018   Elevated blood pressure reading 10/06/2018   Chronic pain of both knees 10/06/2018   Plantar fasciitis 10/06/2018    PCP: Dimas Chyle  REFERRING PROVIDER: Lynne Leader   REFERRING DIAG: R axilla pain   THERAPY DIAG:  No diagnosis found.  Rationale for Evaluation and Treatment Rehabilitation  ONSET DATE: September   SUBJECTIVE:                                                                                                                                                                                      SUBJECTIVE STATEMENT:  07/30/2022 Pt last seen for Eval in June. Has been having pain for about a year now. Only variable/intermittent pain. No pain with with exercise, work/computer work, or stretches. Continues to feel soreness intermittently. Has not had other f/u with MD. Did have neg mammogram last dec, after pain started last September.   Eval: Pain since September. Notes pain most with Burp, hiccups, not coughing. States no pain with exercise, yoga, or work. Works at [employer] [place].  Feels pain is deep. Has been exercising, but has not had formal treatment.   PERTINENT HISTORY: None   PAIN:  Are you having pain? Yes: NPRS scale: 4/10 Pain location: R axilla  Pain description: intermittent, for a few seconds, then goes away. Sore  Aggravating factors: burping,  Relieving factors:  none stated   PRECAUTIONS: None  WEIGHT BEARING RESTRICTIONS No  FALLS:  Has patient fallen in last 6 months? No   PLOF: Independent  PATIENT GOALS  decreased pain   OBJECTIVE: updated 07/30/22  DIAGNOSTIC FINDINGS:   COGNITION:  Overall cognitive status: Within functional limits for tasks assessed    UPPER EXTREMITY ROM:    Cervical and UE ROM: WNL.  Shoulder: WNL   UPPER EXTREMITY MMT:   Shoulder: 4+/5   SHOULDER SPECIAL TESTS: Neg   JOINT MOBILITY TESTING:  WNL  PALPATION:  07/30/22: No tenderness in lateral chest (as on Eval).  Pain today located in R pec, bottom aspect of pec. No pain into breast.  Eval: Tenderness in R lateral chest,  chest wall, lateral to breast tissue and distal to axilla. Minimal pain in lat, or pec. Mild discomfort in subscap.     TODAY'S TREATMENT:   07/30/22 Therapeutic Exercise: Aerobic: Supine: Seated: Standing: Scap retraction x 15;  Stretches: Doorway stretch 30 sec x 3; Supine pec stretch x 2 min; Neuromuscular Re-education: Manual Therapy:  STM/TPR  to R pec    PATIENT EDUCATION: Education details: reviewed plan, findings, updated HEP Person educated: Patient Education method: Explanation, Demonstration, Tactile cues, Verbal cues, and Handouts Education comprehension: verbalized understanding, returned demonstration, verbal cues required, tactile cues required, and needs further education   HOME EXERCISE PROGRAM: Access Code: [email or web address]   ASSESSMENT:  CLINICAL IMPRESSION: Re-Eval: Pt last seen in June for Eval. She continues to have intermittent pain. Soreness/tightness with palpation to R pec today, at bottom/distal aspect. Pt to benefit from continued skilled PT, for trial of pain reduction, to decrease soft tissue tightness and limitation and pain. Discussed need for consistent attendance for this. Due to length of pain duration, if pt does not have improved pain in 4-6 weeks, she will be referred back to MD for  further assessment.    Eval: Patient presents with primary complain of increased pain in R axilla region. She does have tenderness today, in lateral chest, lateral to breast and distal to axilla. Trial for PT will be helpful for attempts for pain relief. Manual tissue release done today, as well as ther ex for stretching of pec, and lat. Pt with good ability for functional activity, with minimal pain, but does have ongoing pain and will benefit from trial of skilled PT.    OBJECTIVE IMPAIRMENTS decreased mobility, increased fascial restrictions, increased muscle spasms, and pain.   ACTIVITY LIMITATIONS lifting and reach over head  PARTICIPATION LIMITATIONS:  none  PERSONAL FACTORS  none   are also affecting patient's functional outcome.   REHAB POTENTIAL: Good  CLINICAL DECISION MAKING: Stable/uncomplicated  EVALUATION COMPLEXITY: Low   GOALS: Goals reviewed with patient? Yes  SHORT TERM GOALS: Target date: 08/13/2022     1. Pt to be independent with initial HEP Goal status: IN PROGRESS  2.  Pt to report tenderness to palpation of R pec region to be improved by at least 50%  Goal status: IN PROGRESS/revised     LONG TERM GOALS: Target date: 09/10/2022     Pt to be independent with final HEP  Goal status: IN PROGRESS  2.  Pt to report decreased pain in R pec region to 0-2/10 with activity, and deep breaths  Goal status: IN PROGRESS  3.  Pt to demo soft tissue limitation, and pain with palpation to axilla region to be wnl.   Goal status: IN PROGRESS     PLAN: PT FREQUENCY: 1-2x/week  PT DURATION: 6 weeks  PLANNED INTERVENTIONS: Therapeutic exercises, Therapeutic activity, Neuromuscular re-education, Patient/Family education, Joint manipulation, Joint mobilization, DME instructions, Dry Needling, Electrical stimulation, Spinal manipulation, Spinal mobilization, Cryotherapy, Moist heat, Taping, Traction, Ultrasound, Ionotophoresis '4mg'$ /ml Dexamethasone, and Manual  therapy  PLAN FOR NEXT SESSION:    Lyndee Hensen, PT, DPT 12:17 PM  07/30/22  PHYSICAL THERAPY DISCHARGE SUMMARY  Visits from Start of Care: 2 Plan: Patient agrees to discharge.  Patient goals were not met. Patient is being discharged due to  - Pt did not return after last visit.    Lyndee Hensen, PT, DPT 1:52 PM  12/20/22

## 2022-07-31 ENCOUNTER — Encounter: Payer: Self-pay | Admitting: Physical Therapy

## 2022-08-06 ENCOUNTER — Encounter: Payer: BC Managed Care – PPO | Admitting: Physical Therapy

## 2022-08-16 ENCOUNTER — Encounter: Payer: BC Managed Care – PPO | Admitting: Physical Therapy

## 2022-08-17 ENCOUNTER — Encounter: Payer: BC Managed Care – PPO | Admitting: Family Medicine

## 2022-08-17 ENCOUNTER — Ambulatory Visit (INDEPENDENT_AMBULATORY_CARE_PROVIDER_SITE_OTHER): Payer: BC Managed Care – PPO | Admitting: Family Medicine

## 2022-08-17 ENCOUNTER — Encounter: Payer: Self-pay | Admitting: Family Medicine

## 2022-08-17 VITALS — BP 125/80 | HR 64 | Temp 98.0°F | Ht 61.0 in | Wt 142.0 lb

## 2022-08-17 DIAGNOSIS — K219 Gastro-esophageal reflux disease without esophagitis: Secondary | ICD-10-CM | POA: Diagnosis not present

## 2022-08-17 DIAGNOSIS — Z1211 Encounter for screening for malignant neoplasm of colon: Secondary | ICD-10-CM

## 2022-08-17 DIAGNOSIS — R739 Hyperglycemia, unspecified: Secondary | ICD-10-CM

## 2022-08-17 DIAGNOSIS — M8580 Other specified disorders of bone density and structure, unspecified site: Secondary | ICD-10-CM

## 2022-08-17 DIAGNOSIS — E559 Vitamin D deficiency, unspecified: Secondary | ICD-10-CM | POA: Diagnosis not present

## 2022-08-17 DIAGNOSIS — Z0001 Encounter for general adult medical examination with abnormal findings: Secondary | ICD-10-CM

## 2022-08-17 DIAGNOSIS — E785 Hyperlipidemia, unspecified: Secondary | ICD-10-CM

## 2022-08-17 DIAGNOSIS — R21 Rash and other nonspecific skin eruption: Secondary | ICD-10-CM | POA: Diagnosis not present

## 2022-08-17 DIAGNOSIS — Z23 Encounter for immunization: Secondary | ICD-10-CM

## 2022-08-17 LAB — COMPREHENSIVE METABOLIC PANEL
ALT: 18 U/L (ref 0–35)
AST: 19 U/L (ref 0–37)
Albumin: 4.2 g/dL (ref 3.5–5.2)
Alkaline Phosphatase: 67 U/L (ref 39–117)
BUN: 16 mg/dL (ref 6–23)
CO2: 27 mEq/L (ref 19–32)
Calcium: 9.4 mg/dL (ref 8.4–10.5)
Chloride: 102 mEq/L (ref 96–112)
Creatinine, Ser: 0.73 mg/dL (ref 0.40–1.20)
GFR: 89.07 mL/min (ref >60.00)
Glucose, Bld: 98 mg/dL (ref 70–99)
Potassium: 3.8 mEq/L (ref 3.5–5.1)
Sodium: 138 mEq/L (ref 135–145)
Total Bilirubin: 0.6 mg/dL (ref 0.2–1.2)
Total Protein: 7.5 g/dL (ref 6.0–8.3)

## 2022-08-17 LAB — CBC
HCT: 42.4 % (ref 36.0–46.0)
Hemoglobin: 14.1 g/dL (ref 12.0–15.0)
MCHC: 33.3 g/dL (ref 30.0–36.0)
MCV: 93.2 fl (ref 78.0–100.0)
Platelets: 263 10*3/uL (ref 150.0–400.0)
RBC: 4.55 Mil/uL (ref 3.87–5.11)
RDW: 13.4 % (ref 11.5–15.5)
WBC: 4.4 10*3/uL (ref 4.0–10.5)

## 2022-08-17 LAB — LIPID PANEL
Cholesterol: 221 mg/dL — ABNORMAL HIGH (ref 0–200)
HDL: 51.3 mg/dL (ref >39.00)
LDL Cholesterol: 137 mg/dL — ABNORMAL HIGH (ref 0–99)
NonHDL: 169.99
Total CHOL/HDL Ratio: 4
Triglycerides: 163 mg/dL — ABNORMAL HIGH (ref 0.0–149.0)
VLDL: 32.6 mg/dL (ref 0.0–40.0)

## 2022-08-17 LAB — HEMOGLOBIN A1C: Hgb A1c MFr Bld: 6 % (ref 4.6–6.5)

## 2022-08-17 LAB — VITAMIN D 25 HYDROXY (VIT D DEFICIENCY, FRACTURES): VITD: 86.67 ng/mL (ref 30.00–100.00)

## 2022-08-17 LAB — TSH: TSH: 5.61 u[IU]/mL — ABNORMAL HIGH (ref 0.35–5.50)

## 2022-08-17 MED ORDER — LEVOCETIRIZINE DIHYDROCHLORIDE 5 MG PO TABS: 5.0000 mg | ORAL_TABLET | Freq: Every evening | ORAL | 0 refills | Status: DC

## 2022-08-17 NOTE — Assessment & Plan Note (Signed)
Check lipids. Discussed lifestyle modifications.  

## 2022-08-17 NOTE — Progress Notes (Signed)
Chief Complaint:  Brandy [name] is a 61 y.o. female who presents today for her annual comprehensive physical exam.    Assessment/Plan:  New/Acute Problems: Right Breast Pain No clear etiology.  She has had musculoskeletal work-up and mammography that have been unrevealing.  History is very atypical for cardiac etiology and her pain is reproducible on exam. Meloxicam has not helped.  Symptoms seem to be improving over the last couple of days.  She will continue taking the omeprazole for now.  If continues to have issue would consider advanced imaging with chest CT or MRI.  Rash Consistent with urticarial rash.  Potentially could be precipitated by hair dye from a few days ago.  We will start Xyzal.  Prescription was sent in today.  She will let me know if not proving.  Chronic Problems Addressed Today: Osteopenia Check vitamin D and DEXA scan.  GERD (gastroesophageal reflux disease) On omeprazole 40 mg daily.  Hyperglycemia Check A1c.  Vitamin D deficiency Check vitamin D.  She is on 1000 IU daily.  Dyslipidemia Check lipids.  Discussed lifestyle modifications.   Preventative Healthcare: Flu shot given today.  Check labs.  We will place referral for colonoscopy.  Patient Counseling(The following topics were reviewed and/or handout was given):  -Nutrition: Stressed importance of moderation in sodium/caffeine intake, saturated fat and cholesterol, caloric balance, sufficient intake of fresh fruits, vegetables, and fiber.  -Stressed the importance of regular exercise.   -Substance Abuse: Discussed cessation/primary prevention of tobacco, alcohol, or other drug use; driving or other dangerous activities under the influence; availability of treatment for abuse.   -Injury prevention: Discussed safety belts, safety helmets, smoke detector, smoking near bedding or upholstery.   -Sexuality: Discussed sexually transmitted diseases, partner selection, use of condoms, avoidance  of unintended pregnancy and contraceptive alternatives.   -Dental health: Discussed importance of regular tooth brushing, flossing, and dental visits.  -Health maintenance and immunizations reviewed. Please refer to Health maintenance section.  Return to care in 1 year for next preventative visit.     Subjective:  HPI:  She is still having persistent pain in right breast. We have seen her a couple of times for this over the last year. She has had extensive work up for this including mammogram and xray. She has also seen PT a couple of times and saw sports medicine about 5 months ago for this.  She had a couple of "deep massages" with physical therapy the left bruising.  She does not wish to go back to have this done.  She started some exercise as well which did not seem to help.  Sports medicine started her back on omeprazole.  She did not initially have any improvement with this though symptoms seem to be improving over the last week or so.  She has also had a rash the last couple of days.  No new obvious exposures or detergents.  She did have her hair dyed several days ago.  No treatments tried.  Located on extremities and trunk.  They are itchy.  No treatments tried.  She has never anything like this in the past.  See a/p for status of chronic conditions  Lifestyle Diet: Plenty of fruits and vegetables.  Exercise: Exercises daily.      08/17/2022    8:40 AM  Depression screen PHQ 2/9  Decreased Interest 0  Down, Depressed, Hopeless 0  PHQ - 2 Score 0    Health Maintenance Due  Topic Date Due   Fecal  DNA (Cologuard)  07/29/2022     ROS: Per HPI, otherwise a complete review of systems was negative.   PMH:  The following were reviewed and entered/updated in epic: History reviewed. No pertinent past medical history. Patient Active Problem List   Diagnosis Date Noted   Osteopenia 08/17/2022   Epigastric pain 03/08/2021   GERD (gastroesophageal reflux disease) 12/26/2020    Bilateral hand pain 12/26/2020   Osteoarthritis 08/01/2020   Vitamin D deficiency 07/13/2019   Hyperglycemia 07/13/2019   Dyslipidemia 10/08/2018   Elevated blood pressure reading 10/06/2018   Chronic pain of both knees 10/06/2018   Plantar fasciitis 10/06/2018   Past Surgical History:  Procedure Laterality Date   MYOMECTOMY     2007    Family History  Problem Relation Age of Onset   Parkinson's disease Mother    Hypertension Father    Diverticulitis Father    Heart attack Father    Colon cancer Neg Hx    Esophageal cancer Neg Hx    Pancreatic cancer Neg Hx    Stomach cancer Neg Hx    Inflammatory bowel disease Neg Hx    Liver disease Neg Hx    Rectal cancer Neg Hx     Medications- reviewed and updated Current Outpatient Medications  Medication Sig Dispense Refill   cholecalciferol (VITAMIN D3) 25 MCG (1000 UNIT) tablet Take 1,000 Units by mouth daily.     levocetirizine (XYZAL) 5 MG tablet Take 1 tablet (5 mg total) by mouth every evening. 30 tablet 0   meloxicam (MOBIC) 15 MG tablet TAKE 1 TABLET (15 MG TOTAL) BY MOUTH DAILY. 30 tablet 0   omega-3 acid ethyl esters (LOVAZA) 1 g capsule Take by mouth 2 (two) times daily.     omeprazole (PRILOSEC) 40 MG capsule Take 1 capsule (40 mg total) by mouth daily. 30 capsule 1   No current facility-administered medications for this visit.    Allergies-reviewed and updated No Known Allergies  Social History   Socioeconomic History   Marital status: Married    Spouse name: Not on file   Number of children: Not on file   Years of education: Not on file   Highest education level: Not on file  Occupational History   Not on file  Tobacco Use   Smoking status: Never   Smokeless tobacco: Never  Vaping Use   Vaping Use: Never used  Substance and Sexual Activity   Alcohol use: Not Currently   Drug use: Never   Sexual activity: Not on file  Other Topics Concern   Not on file  Social History Narrative   Not on file    Social Determinants of Health   Financial Resource Strain: Not on file  Food Insecurity: Not on file  Transportation Needs: Not on file  Physical Activity: Not on file  Stress: Not on file  Social Connections: Not on file        Objective:  Physical Exam: BP 125/80   Pulse 64   Temp 98 F (36.7 C) (Temporal)   Ht 5\' 1"  (1.549 m)   Wt 142 lb (64.4 kg)   SpO2 99%   BMI 26.83 kg/m   Body mass index is 26.83 kg/m. Wt Readings from Last 3 Encounters:  08/17/22 142 lb (64.4 kg)  03/16/22 142 lb 9.6 oz (64.7 kg)  03/09/22 141 lb 12.8 oz (64.3 kg)   Gen: NAD, resting comfortably HEENT: TMs normal bilaterally. OP clear. No thyromegaly noted.  CV: RRR with no murmurs  appreciated Pulm: NWOB, CTAB with no crackles, wheezes, or rhonchi GI: Normal bowel sounds present. Soft, Nontender, Nondistended. MSK: no edema, cyanosis, or clubbing noted Skin: warm, dry.  Urticarial rash on extremities and trunk Neuro: CN2-12 grossly intact. Strength 5/5 in upper and lower extremities. Reflexes symmetric and intact bilaterally.  Psych: Normal affect and thought content     Craven Crean M. Jerline Pain, MD 08/17/2022 9:18 AM

## 2022-08-17 NOTE — Assessment & Plan Note (Signed)
Check vitamin D.  She is on 1000 IU daily. 

## 2022-08-17 NOTE — Assessment & Plan Note (Signed)
Check A1c. 

## 2022-08-17 NOTE — Assessment & Plan Note (Signed)
Check vitamin D and DEXA scan.

## 2022-08-17 NOTE — Assessment & Plan Note (Signed)
On omeprazole 40 mg daily. 

## 2022-08-17 NOTE — Patient Instructions (Signed)
It was very nice to see you today!  We will give your flu shot today.  We will check blood work.  Please let me know if the pain in your right chest does not improve.  Please take the Xyzal for your rash.  I will send a prescription in for this.  Let me know if not improving.  Please continue to work on diet and exercise.  I will see back in year for your next physical.  Please come back to see [place] sooner if needed.  Take care, Dr Jerline Pain  PLEASE NOTE:  If you had any lab tests please let us know if you have not heard back within a few days. You may see your results on mychart before we have a chance to review them but we will give you a call once they are reviewed by [place]. If we ordered any referrals today, please let us know if you have not heard from their office within the next week.   Please try these tips to maintain a healthy lifestyle:  Eat at least 3 REAL meals and 1-2 snacks per day.  Aim for no more than 5 hours between eating.  If you eat breakfast, please do so within one hour of getting up.   Each meal should contain half fruits/vegetables, one quarter protein, and one quarter carbs (no bigger than a computer mouse)  Cut down on sweet beverages. This includes juice, soda, and sweet tea.   Drink at least 1 glass of water with each meal and aim for at least 8 glasses per day  Exercise at least 150 minutes every week.    Preventive Care 69-39 Years Old, Female Preventive care refers to lifestyle choices and visits with your health care provider that can promote health and wellness. Preventive care visits are also called wellness exams. What can I expect for my preventive care visit? Counseling Your health care provider may ask you questions about your: Medical history, including: Past medical problems. Family medical history. Pregnancy history. Current health, including: Menstrual cycle. Method of birth control. Emotional well-being. Home life and relationship  well-being. Sexual activity and sexual health. Lifestyle, including: Alcohol, nicotine or tobacco, and drug use. Access to firearms. Diet, exercise, and sleep habits. Work and work Statistician. Sunscreen use. Safety issues such as seatbelt and bike helmet use. Physical exam Your health care provider will check your: Height and weight. These may be used to calculate your BMI (body mass index). BMI is a measurement that tells if you are at a healthy weight. Waist circumference. This measures the distance around your waistline. This measurement also tells if you are at a healthy weight and may help predict your risk of certain diseases, such as type 2 diabetes and high blood pressure. Heart rate and blood pressure. Body temperature. Skin for abnormal spots. What immunizations do I need?  Vaccines are usually given at various ages, according to a schedule. Your health care provider will recommend vaccines for you based on your age, medical history, and lifestyle or other factors, such as travel or where you work. What tests do I need? Screening Your health care provider may recommend screening tests for certain conditions. This may include: Lipid and cholesterol levels. Diabetes screening. This is done by checking your blood sugar (glucose) after you have not eaten for a while (fasting). Pelvic exam and Pap test. Hepatitis B test. Hepatitis C test. HIV (human immunodeficiency virus) test. STI (sexually transmitted infection) testing, if you are at risk.  Lung cancer screening. Colorectal cancer screening. Mammogram. Talk with your health care provider about when you should start having regular mammograms. This may depend on whether you have a family history of breast cancer. BRCA-related cancer screening. This may be done if you have a family history of breast, ovarian, tubal, or peritoneal cancers. Bone density scan. This is done to screen for osteoporosis. Talk with your health care  provider about your test results, treatment options, and if necessary, the need for more tests. Follow these instructions at home: Eating and drinking  Eat a diet that includes fresh fruits and vegetables, whole grains, lean protein, and low-fat dairy products. Take vitamin and mineral supplements as recommended by your health care provider. Do not drink alcohol if: Your health care provider tells you not to drink. You are pregnant, may be pregnant, or are planning to become pregnant. If you drink alcohol: Limit how much you have to 0-1 drink a day. Know how much alcohol is in your drink. In the [place] one drink equals one 12 oz bottle of beer (355 mL), one 5 oz glass of wine (148 mL), or one 1 oz glass of hard liquor (44 mL). Lifestyle Brush your teeth every morning and night with fluoride toothpaste. Floss one time each day. Exercise for at least 30 minutes 5 or more days each week. Do not use any products that contain nicotine or tobacco. These products include cigarettes, chewing tobacco, and vaping devices, such as e-cigarettes. If you need help quitting, ask your health care provider. Do not use drugs. If you are sexually active, practice safe sex. Use a condom or other form of protection to prevent STIs. If you do not wish to become pregnant, use a form of birth control. If you plan to become pregnant, see your health care provider for a prepregnancy visit. Take aspirin only as told by your health care provider. Make sure that you understand how much to take and what form to take. Work with your health care provider to find out whether it is safe and beneficial for you to take aspirin daily. Find healthy ways to manage stress, such as: Meditation, yoga, or listening to music. Journaling. Talking to a trusted person. Spending time with friends and family. Minimize exposure to UV radiation to reduce your risk of skin cancer. Safety Always wear your seat belt while driving or riding in  a vehicle. Do not drive: If you have been drinking alcohol. Do not ride with someone who has been drinking. When you are tired or distracted. While texting. If you have been using any mind-altering substances or drugs. Wear a helmet and other protective equipment during sports activities. If you have firearms in your house, make sure you follow all gun safety procedures. Seek help if you have been physically or sexually abused. What's next? Visit your health care provider once a year for an annual wellness visit. Ask your health care provider how often you should have your eyes and teeth checked. Stay up to date on all vaccines. This information is not intended to replace advice given to you by your health care provider. Make sure you discuss any questions you have with your health care provider. Document Revised: 04/05/2021 Document Reviewed: 04/05/2021 Elsevier Patient Education  [place].

## 2022-08-21 NOTE — Progress Notes (Signed)
Please inform patient of the following:  Thyroid level is borderline.  Recommend she come back to recheck TSH, free T4, and free t3.  Please place future order.  Cholesterol still borderline elevated.  Her sugar is also borderline elevated.  We do not need to start meds for weeks but she should continue to work on diet and exercise.  There are thing else is stable and we can recheck in a year.

## 2022-08-22 ENCOUNTER — Other Ambulatory Visit: Payer: Self-pay | Admitting: *Deleted

## 2022-08-22 DIAGNOSIS — E038 Other specified hypothyroidism: Secondary | ICD-10-CM

## 2022-08-23 ENCOUNTER — Other Ambulatory Visit (INDEPENDENT_AMBULATORY_CARE_PROVIDER_SITE_OTHER): Payer: BC Managed Care – PPO

## 2022-08-23 DIAGNOSIS — E038 Other specified hypothyroidism: Secondary | ICD-10-CM | POA: Diagnosis not present

## 2022-08-23 LAB — TSH: TSH: 4.66 u[IU]/mL (ref 0.35–5.50)

## 2022-08-23 LAB — T3, FREE: T3, Free: 4 pg/mL (ref 2.3–4.2)

## 2022-08-23 LAB — T4, FREE: Free T4: 0.71 ng/dL (ref 0.60–1.60)

## 2022-08-23 NOTE — Progress Notes (Signed)
Please inform patient of the following:  Thyroid tests are all back to normal. Do not need to do any further testing. We can recheck everything in a year.

## 2022-09-03 ENCOUNTER — Ambulatory Visit (HOSPITAL_BASED_OUTPATIENT_CLINIC_OR_DEPARTMENT_OTHER)
Admission: RE | Admit: 2022-09-03 | Discharge: 2022-09-03 | Disposition: A | Discharge status: Home / Self Care | Payer: BC Managed Care – PPO | Source: Ambulatory Visit | Attending: Family Medicine | Admitting: Family Medicine

## 2022-09-03 DIAGNOSIS — Z780 Asymptomatic menopausal state: Secondary | ICD-10-CM | POA: Diagnosis not present

## 2022-09-03 DIAGNOSIS — Z0001 Encounter for general adult medical examination with abnormal findings: Secondary | ICD-10-CM | POA: Insufficient documentation

## 2022-09-03 DIAGNOSIS — M8580 Other specified disorders of bone density and structure, unspecified site: Secondary | ICD-10-CM | POA: Diagnosis not present

## 2022-09-03 DIAGNOSIS — M8588 Other specified disorders of bone density and structure, other site: Secondary | ICD-10-CM | POA: Diagnosis not present

## 2022-09-06 NOTE — Progress Notes (Signed)
Please inform patient of the following:  Her bone density scan indicates that she has mild thinning of the bone called osteopenia.  Her bones are about the same as last time that we checked.  Do not need to start any medications for this.  She should continue calcium and vitamin D supplementation and we can recheck again in a couple of years.

## 2022-09-13 ENCOUNTER — Other Ambulatory Visit: Payer: Self-pay | Admitting: Family Medicine

## 2023-01-09 DIAGNOSIS — F320 Major depressive disorder, single episode, mild: Secondary | ICD-10-CM | POA: Diagnosis not present

## 2023-01-28 ENCOUNTER — Encounter: Payer: Self-pay | Admitting: Family Medicine

## 2023-01-28 DIAGNOSIS — R0789 Other chest pain: Secondary | ICD-10-CM

## 2023-01-28 NOTE — Telephone Encounter (Signed)
Please advise 

## 2023-02-18 ENCOUNTER — Encounter: Payer: Self-pay | Admitting: Family Medicine

## 2023-02-18 ENCOUNTER — Ambulatory Visit: Payer: BC Managed Care – PPO | Admitting: Family Medicine

## 2023-02-18 VITALS — BP 132/81 | HR 66 | Temp 98.0°F | Resp 16 | Ht 62.0 in | Wt 132.8 lb

## 2023-02-18 DIAGNOSIS — F439 Reaction to severe stress, unspecified: Secondary | ICD-10-CM | POA: Diagnosis not present

## 2023-02-18 DIAGNOSIS — M1990 Unspecified osteoarthritis, unspecified site: Secondary | ICD-10-CM | POA: Diagnosis not present

## 2023-02-18 DIAGNOSIS — Z1211 Encounter for screening for malignant neoplasm of colon: Secondary | ICD-10-CM

## 2023-02-18 MED ORDER — TERBINAFINE HCL 250 MG PO TABS: 250.0000 mg | ORAL_TABLET | Freq: Every day | ORAL | 0 refills | Status: DC

## 2023-02-18 NOTE — Progress Notes (Signed)
   Brandy [name] is a 62 y.o. female who presents today for an office visit.  Assessment/Plan:  New/Acute Problems: Onychomycosis No red flags.  Symptoms are fairly mild at this point.  She has not responded to topical therapies.  We discussed treatment options.  Will start terbinafine for 3 months.  She may be able to stop early if symptoms resolve before then.  LFTs normal recent labs.  She is aware of reasons to return to care.  Chronic Problems Addressed Today: Stress Patient currently going through divorce.  This has been a large source of stress for her.  No SI or HI.  Not interested in medications.  We will refer to therapy.  Osteoarthritis Stable on meloxicam as needed.  She is still having some persistent right flank pain.  Has upcoming CT scan later this week.    Subjective:  HPI:  Patient here with toneail fungus. This has been going on for a couple of months. Seems to be getting worse.  Located on bilateral great toenails.  OTC meds have not worked.   She is also going through divorce.  This has been a significant source of stress for her.  She is interested in seeing a therapist.  No reported SI or HI.       Objective:  Physical Exam: BP 132/81   Pulse 66   Temp 98 F (36.7 C) (Temporal)   Resp 16   Ht 5\' 2"  (1.575 m)   Wt 132 lb 12.8 oz (60.2 kg)   SpO2 99%   BMI 24.29 kg/m   Gen: No acute distress, resting comfortably CV: Regular rate and rhythm with no murmurs appreciated Pulm: Normal work of breathing, clear to auscultation bilaterally with no crackles, wheezes, or rhonchi MSK: Bilateral great toenails with onychomycosis. Neuro: Grossly normal, moves all extremities Psych: Normal affect and thought content      Gracie Gupta M. [name], MD 02/18/2023 11:49 AM

## 2023-02-18 NOTE — Patient Instructions (Signed)
It was very nice to see you today!  Please start the terbinafine.  Take every day for 3 months.  You can stop it if your symptoms resolve.  Will refer you to see the psychologist and also refer you for colonoscopy.  Return if symptoms worsen or fail to improve.   Take care, Dr [name]  PLEASE NOTE:  If you had any lab tests, please let us know if you have not heard back within a few days. You may see your results on mychart before we have a chance to review them but we will give you a call once they are reviewed by [place].   If we ordered any referrals today, please let us know if you have not heard from their office within the next week.   If you had any urgent prescriptions sent in today, please check with the pharmacy within an hour of our visit to make sure the prescription was transmitted appropriately.   Please try these tips to maintain a healthy lifestyle:  Eat at least 3 REAL meals and 1-2 snacks per day.  Aim for no more than 5 hours between eating.  If you eat breakfast, please do so within one hour of getting up.   Each meal should contain half fruits/vegetables, one quarter protein, and one quarter carbs (no bigger than a computer mouse)  Cut down on sweet beverages. This includes juice, soda, and sweet tea.   Drink at least 1 glass of water with each meal and aim for at least 8 glasses per day  Exercise at least 150 minutes every week.
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
## 2023-02-18 NOTE — Assessment & Plan Note (Signed)
Stable on meloxicam as needed.  She is still having some persistent right flank pain.  Has upcoming CT scan later this week.

## 2023-02-27 ENCOUNTER — Ambulatory Visit
Admission: RE | Admit: 2023-02-27 | Discharge: 2023-02-27 | Disposition: A | Discharge status: Home / Self Care | Payer: BC Managed Care – PPO | Source: Ambulatory Visit | Attending: Family Medicine | Admitting: Family Medicine

## 2023-02-27 DIAGNOSIS — Z1231 Encounter for screening mammogram for malignant neoplasm of breast: Secondary | ICD-10-CM | POA: Diagnosis not present

## 2023-03-01 ENCOUNTER — Ambulatory Visit
Admission: RE | Admit: 2023-03-01 | Discharge: 2023-03-01 | Disposition: A | Discharge status: Home / Self Care | Payer: BC Managed Care – PPO | Source: Ambulatory Visit | Attending: Family Medicine | Admitting: Family Medicine

## 2023-03-01 DIAGNOSIS — R0789 Other chest pain: Secondary | ICD-10-CM

## 2023-03-07 NOTE — Progress Notes (Signed)
Her CT scan is negative.  No findings that would explain her pain.  We can have her follow back up with sports medicine or physical therapy if she is still having persistent symptoms.

## 2023-03-08 NOTE — Progress Notes (Signed)
We can order but I am not sure if it would show anything useful at this point. Recommend she discuss with orthopedics or sports medicine.  Katina Degree. [name], MD 03/08/2023 12:35 PM

## 2023-03-14 ENCOUNTER — Ambulatory Visit (INDEPENDENT_AMBULATORY_CARE_PROVIDER_SITE_OTHER): Payer: BC Managed Care – PPO | Admitting: Psychology

## 2023-03-14 DIAGNOSIS — F4323 Adjustment disorder with mixed anxiety and depressed mood: Secondary | ICD-10-CM
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
## 2023-03-25 ENCOUNTER — Ambulatory Visit: Payer: BC Managed Care – PPO | Admitting: Psychology

## 2023-03-25 DIAGNOSIS — F4323 Adjustment disorder with mixed anxiety and depressed mood: Secondary | ICD-10-CM

## 2023-03-25 NOTE — Progress Notes (Signed)
[facility] Counselor/Therapist Progress Note  Patient ID: Brandy [name], MRN: [id number]   Date: 03/25/23  Time Spent: 5:03  pm - 5:52 pm : 49 Minutes  Treatment Type: Individual Therapy.  Reported Symptoms: depression and anxiety  Mental Status Exam: Appearance:  Casual     Behavior: Appropriate  Motor: Normal  Speech/Language:  Clear and Coherent  Affect: Congruent  Mood: dysthymic  Thought process: normal  Thought content:   WNL  Sensory/Perceptual disturbances:   WNL  Orientation: oriented to person, place, time/date, and situation  Attention: Good  Concentration: Good  Memory: WNL  Fund of knowledge:  Good  Insight:   Good  Judgment:  Good  Impulse Control: Good   Risk Assessment: Danger to Self:  No Self-injurious Behavior: No Danger to Others: No Duty to Warn:no Physical Aggression / Violence:No  Access to Firearms a concern: No  Gang Involvement:No   Subjective:   Brandy [name] participated from home, via video and consented to treatment. Therapist participated from home office. We met online due to COVID pandemic. Brandy [name] reviewed the events of the past week. We reviewed numerous treatment approaches including CBT, BA, Problem Solving, and Solution focused therapy. Psych-education regarding the [name]'s diagnosis of Adjustment disorder with mixed anxiety and depressed mood was provided during the session. We discussed Brandy [name]'s goals treatment goals including bolstering coping skills and proactively manage stress, working on improving quality of sleep, manage her overall mood, engage in self-care, reduce rumination, and focus on sense of self.  Brandy [name] discussed her interest in setting a goal of exercise once a week of a 50 minute yoga class. She has been doing yoga once every two weeks. We discussed the importance of self-talk, problem-solving, scheduling, preparation, and setting reasonable expectations. We  applied BA principles in relation to her goals. Brandy [name] provided verbal approval of the treatment plan.   Interventions: Psycho-education & Goal Setting.   Diagnosis:  Adjustment disorder with mixed anxiety and depressed mood  Psychiatric Treatment: No , but interest in a psychiatric referral.    Treatment Plan:  Client Abilities/Strengths Brandy [name] is intelligent, forthcoming, and motivated for change.   Support System: Friends and family.   Client Treatment Preferences Outpatient Therapy.   Client Statement of Needs Brandy [name] would like to improve coping skills and proactively manage stress, working on improving quality of sleep, manage her overall mood, engage in self-care, reduce rumination, and focus on sense of self.    Treatment Level Weekly  Symptoms  Anxiety: feeling nervious, difficulty managing worry, worrying about different things, trouble relaxing, irritability, feeling afraid something awful might happen.    (Status: maintained) Depression: loss of interest, feeling down, poor sleep, lethargy, fluctuating appetite, feeling bad about self,  difficulty concentrating,      (Status: maintained)  Goals:   Brandy [name] experiences symptoms of depression and anxiety.   Treatment plan signed and available on s-drive:  No: Pending signature.    Target Date: 03/24/24 Frequency: Weekly  Progress: 0 Modality: individual    Therapist will provide referrals for additional resources as appropriate.  Therapist will provide psycho-education regarding Brandy [name] diagnosis and corresponding treatment approaches and interventions. Licensed Clinical Social Worker, [place], LCSW will support the patient's ability to achieve the goals identified. will employ CBT, BA, Problem-solving, Solution Focused, Mindfulness,  coping skills, & other evidenced-based practices will be used to promote progress towards healthy functioning to help manage decrease symptoms associated  with her diagnosis.  Reduce overall level, frequency, and intensity of the feelings of depression, anxiety and panic evidenced by decreased  from 6 to 7 days/week to 0 to 1 days/week per client report for at least 3 consecutive months. Verbally express understanding of the relationship between feelings of depression, anxiety and their impact on thinking patterns and behaviors. Verbalize an understanding of the role that distorted thinking plays in creating fears, excessive worry, and ruminations.    Brandy [name] participated in the creation of the treatment plan)    Delight Ovens, LCSW

## 2023-03-27 ENCOUNTER — Telehealth: Payer: Self-pay | Admitting: Family Medicine

## 2023-03-27 NOTE — Telephone Encounter (Signed)
Patient states new employer needs her to do a TB skin. States her last one was in December 2019 and employer informed her to ask PCP if last TB test was current. Please Advise.

## 2023-03-27 NOTE — Telephone Encounter (Signed)
Need an nurse visit for TB testing

## 2023-04-02 ENCOUNTER — Ambulatory Visit (INDEPENDENT_AMBULATORY_CARE_PROVIDER_SITE_OTHER): Payer: BC Managed Care – PPO

## 2023-04-02 DIAGNOSIS — Z111 Encounter for screening for respiratory tuberculosis: Secondary | ICD-10-CM

## 2023-04-02 NOTE — Progress Notes (Signed)
Tuberculin skin test applied to left ventral forearm. Explained how to read the test, measuring induration not just erythema; she will come into office if test appears positive. Donzetta Starch, CMA

## 2023-04-04 ENCOUNTER — Ambulatory Visit: Payer: BC Managed Care – PPO | Admitting: *Deleted

## 2023-04-04 DIAGNOSIS — Z111 Encounter for screening for respiratory tuberculosis: Secondary | ICD-10-CM

## 2023-04-04 LAB — TB SKIN TEST
Induration: 0 mm
TB Skin Test: NEGATIVE

## 2023-04-04 NOTE — Progress Notes (Signed)
PPD Reading Note  PPD read and results entered in EpicCare.  Result: 0 mm induration.  Interpretation: negative  Allergic reaction: no

## 2023-04-05 ENCOUNTER — Ambulatory Visit: Payer: BC Managed Care – PPO

## 2023-04-16 ENCOUNTER — Ambulatory Visit: Payer: BC Managed Care – PPO | Admitting: Psychology

## 2023-04-18 ENCOUNTER — Encounter: Payer: Self-pay | Admitting: Gastroenterology

## 2023-04-18 ENCOUNTER — Ambulatory Visit (AMBULATORY_SURGERY_CENTER): Payer: BC Managed Care – PPO

## 2023-04-18 VITALS — Ht 61.0 in | Wt 130.0 lb

## 2023-04-18 DIAGNOSIS — Z1211 Encounter for screening for malignant neoplasm of colon: Secondary | ICD-10-CM

## 2023-04-18 MED ORDER — NA SULFATE-K SULFATE-MG SULF 17.5-3.13-1.6 GM/177ML PO SOLN: 1.0000 | Freq: Once | ORAL | 0 refills | Status: AC

## 2023-04-18 NOTE — Progress Notes (Signed)
Pre visit completed via phone call; Patient verified name, DOB, and address;  No egg or soy allergy known to patient  No issues known to pt with past sedation with any surgeries or procedures; Patient denies ever being told they had issues or difficulty with intubation;  No FH of Malignant Hyperthermia Pt is not on diet pills; Pt is not on home 02;  Pt is not on blood thinners  Pt denies issues with constipation;  No A fib or A flutter Have any cardiac testing pending--NO Pt instructed to use Singlecare.com or GoodRx for a price reduction on prep   Insurance verified during PV appt=BCBS PPO  Patient's chart reviewed by Cathlyn Parsons CNRA prior to previsit and patient appropriate for the [facility].  Previsit completed and red dot placed by patient's name on their procedure day (on provider's schedule).    Instructions sent to patient via MyChart and also printed along with a [facility] GoodRx coupon and mailed to the patient per her request;

## 2023-05-06 ENCOUNTER — Telehealth: Payer: Self-pay | Admitting: Gastroenterology

## 2023-05-06 NOTE — Telephone Encounter (Signed)
Patient called to cancel her procedure stated she is sick.

## 2023-05-07 ENCOUNTER — Encounter: Payer: BC Managed Care – PPO | Admitting: Gastroenterology

## 2023-07-03 ENCOUNTER — Telehealth: Payer: Self-pay | Admitting: Family Medicine

## 2023-07-03 NOTE — Telephone Encounter (Signed)
Pt is wanting to [facility] with Dr Drue Novel from Dr [name], if ok with each provider to [facility]. Please advise.

## 2023-07-03 NOTE — Telephone Encounter (Signed)
Ok for me

## 2023-07-04 NOTE — Telephone Encounter (Signed)
Ok with me.   Brandy [name]. [name], MD 07/04/2023 12:16 PM

## 2023-07-04 NOTE — Telephone Encounter (Signed)
Ok to [facility]  See note

## 2023-07-16 NOTE — Telephone Encounter (Signed)
Pt schedule for 08-19-2023. Done

## 2023-08-19 ENCOUNTER — Encounter: Payer: BC Managed Care – PPO | Admitting: Internal Medicine

## 2023-08-20 ENCOUNTER — Encounter: Payer: BC Managed Care – PPO | Admitting: Family Medicine

## 2023-08-21 ENCOUNTER — Ambulatory Visit (INDEPENDENT_AMBULATORY_CARE_PROVIDER_SITE_OTHER): Payer: BC Managed Care – PPO | Admitting: Internal Medicine

## 2023-08-21 ENCOUNTER — Encounter: Payer: Self-pay | Admitting: Internal Medicine

## 2023-08-21 VITALS — BP 122/82 | HR 65 | Temp 98.4°F | Resp 16 | Ht 62.0 in | Wt 135.2 lb

## 2023-08-21 DIAGNOSIS — R7989 Other specified abnormal findings of blood chemistry: Secondary | ICD-10-CM

## 2023-08-21 DIAGNOSIS — E785 Hyperlipidemia, unspecified: Secondary | ICD-10-CM

## 2023-08-21 DIAGNOSIS — B1910 Unspecified viral hepatitis B without hepatic coma: Secondary | ICD-10-CM

## 2023-08-21 DIAGNOSIS — R0789 Other chest pain: Secondary | ICD-10-CM | POA: Diagnosis not present

## 2023-08-21 DIAGNOSIS — Z09 Encounter for follow-up examination after completed treatment for conditions other than malignant neoplasm: Secondary | ICD-10-CM | POA: Insufficient documentation

## 2023-08-21 DIAGNOSIS — R739 Hyperglycemia, unspecified: Secondary | ICD-10-CM | POA: Diagnosis not present

## 2023-08-21 LAB — LIPID PANEL
Cholesterol: 227 mg/dL — ABNORMAL HIGH (ref 0–200)
HDL: 62.5 mg/dL (ref >39.00)
LDL Cholesterol: 130 mg/dL — ABNORMAL HIGH (ref 0–99)
NonHDL: 164.28
Total CHOL/HDL Ratio: 4
Triglycerides: 171 mg/dL — ABNORMAL HIGH (ref 0.0–149.0)
VLDL: 34.2 mg/dL (ref 0.0–40.0)

## 2023-08-21 LAB — T4, FREE: Free T4: 0.75 ng/dL (ref 0.60–1.60)

## 2023-08-21 LAB — COMPREHENSIVE METABOLIC PANEL
ALT: 24 U/L (ref 0–35)
AST: 18 U/L (ref 0–37)
Albumin: 4.3 g/dL (ref 3.5–5.2)
Alkaline Phosphatase: 68 U/L (ref 39–117)
BUN: 14 mg/dL (ref 6–23)
CO2: 28 meq/L (ref 19–32)
Calcium: 9.2 mg/dL (ref 8.4–10.5)
Chloride: 104 meq/L (ref 96–112)
Creatinine, Ser: 0.75 mg/dL (ref 0.40–1.20)
GFR: 85.62 mL/min (ref >60.00)
Glucose, Bld: 92 mg/dL (ref 70–99)
Potassium: 4.2 meq/L (ref 3.5–5.1)
Sodium: 139 meq/L (ref 135–145)
Total Bilirubin: 0.8 mg/dL (ref 0.2–1.2)
Total Protein: 7.4 g/dL (ref 6.0–8.3)

## 2023-08-21 LAB — TSH: TSH: 2.92 u[IU]/mL (ref 0.35–5.50)

## 2023-08-21 LAB — HEMOGLOBIN A1C: Hgb A1c MFr Bld: 5.9 % (ref 4.6–6.5)

## 2023-08-21 NOTE — Assessment & Plan Note (Signed)
Transferring care to this office  R chest wall pain: Workup negative including a CT chest 03/01/2023: Unremarkable. Most serious/dangerous etiologies for pain has been ruled out.  Recommend observation, patient agrees. Anxiety depression: Symptoms triggered by going through a divorce, did psychotherapy, exercises regularly, happy with her job.  Overall improved. Dyslipidemia: Checking FLP. Hyperglycemia: Checking A1c, further advised with results. Slight increased TSH: Per chart review, check TFTs. Hep B: Diagnosed with hep B as a young person, recently her first husband from years ago died from hep C. Her LFTs have  been normal, had a negative hepatitis C serology in 2019.  Plan: Check hepatitis B serology. RTC 3 months for a checkup.

## 2023-08-21 NOTE — Progress Notes (Signed)
Subjective:    Patient ID: Brandy [name], female    DOB: 08/08/1961, 62 y.o.   MRN: [id number]  DOS:  08/21/2023 Type of visit - description: Transfer of care  Transferring care to this office.  Like to discuss multiple concerns. 2 years history of right chest wall pain, symptoms are on and off, mild, not severe. Pain  last few seconds. No change with torso or arm motion.  No change with p.o. intake. Workup reviewed.  Concerned about dyslipidemia.  Anxiety depression: Doing better.  Reports that she was diagnosed with hepatitis B as a young woman.  Recently learned that her first husband died from hep C.   Review of Systems See above   Past Medical History:  Diagnosis Date   GERD (gastroesophageal reflux disease)    hx of   Osteopenia     Past Surgical History:  Procedure Laterality Date   CESAREAN SECTION  2000   MYOMECTOMY  2007   Social History   Socioeconomic History   Marital status: Legally Separated    Spouse name: Not on file   Number of children: 2   Years of education: Not on file   Highest education level: Not on file  Occupational History   Not on file  Tobacco Use   Smoking status: Never   Smokeless tobacco: Never  Vaping Use   Vaping status: Never Used  Substance and Sexual Activity   Alcohol use: Not Currently   Drug use: Never   Sexual activity: Not on file  Other Topics Concern   Not on file  Social History Narrative   Divorcing from 2nd husband, lives by herself    Children live in [place]   Son  2000   Daughter  1992   Social Determinants of Health   Financial Resource Strain: Not on file  Food Insecurity: Not on file  Transportation Needs: Not on file  Physical Activity: Not on file  Stress: Not on file  Social Connections: Not on file  Intimate Partner Violence: Not on file     Current Outpatient Medications  Medication Instructions   cholecalciferol (VITAMIN D3) 1,000 Units, Oral, 2 times weekly   MAGNESIUM  PO Oral   Omega-3 Fatty Acids (FISH OIL PO) Oral   vitamin B-12 (CYANOCOBALAMIN) 100 mcg, Oral, Daily       Objective:   Physical Exam BP 122/82   Pulse 65   Temp 98.4 F (36.9 C) (Oral)   Resp 16   Ht 5\' 2"  (1.575 m)   Wt 135 lb 4 oz (61.3 kg)   SpO2 98%   BMI 24.74 kg/m  General:   Well developed, NAD, BMI noted.  HEENT:  Normocephalic . Face symmetric, atraumatic Lungs:  CTA B Normal respiratory effort, no intercostal retractions, no accessory muscle use. Chest wall: Lateral chest wall no TTP on either side Heart: RRR,  no murmur.  Abdomen:  Not distended, soft, non-tender. No rebound or rigidity.   Skin: Not pale. Not jaundice Lower extremities: no pretibial edema bilaterally  Neurologic:  alert & oriented X3.  Speech normal, gait appropriate for age and unassisted Psych--  Cognition and judgment appear intact.  Cooperative with normal attention span and concentration.  Behavior appropriate. No anxious or depressed appearing.     Assessment    ASSESSMENT Hyperglycemia Anxiety, depression Dyslipidemia Vitamin D deficiency DJD Osteopenia  PLAN Transferring care to this office  R chest wall pain: Workup negative including a CT chest 03/01/2023: Unremarkable. Most  serious/dangerous etiologies for pain has been ruled out.  Recommend observation, patient agrees. Anxiety depression: Symptoms triggered by going through a divorce, did psychotherapy, exercises regularly, happy with her job.  Overall improved. Dyslipidemia: Checking FLP. Hyperglycemia: Checking A1c, further advised with results. Slight increased TSH: Per chart review, check TFTs. Hep B: Diagnosed with hep B as a young person, recently her first husband from years ago died from hep C. Her LFTs have  been normal, had a negative hepatitis C serology in 2019.  Plan: Check hepatitis B serology. RTC 3 months for a checkup.

## 2023-08-21 NOTE — Patient Instructions (Addendum)
Vaccines I recommend: Shingrix (shingles) RSV vaccine Flu shot     GO TO THE LAB : Get the blood work     Next visit with me in 3 months for a checkup     Please schedule it at the front desk

## 2023-08-22 LAB — HEPATITIS B CORE ANTIBODY, TOTAL: Hep B Core Total Ab: NONREACTIVE

## 2023-08-22 LAB — HEPATITIS B SURFACE ANTIGEN: Hepatitis B Surface Ag: NONREACTIVE

## 2023-08-22 LAB — HEPATITIS B SURFACE ANTIBODY,QUALITATIVE: Hep B S Ab: NONREACTIVE

## 2023-09-02 DIAGNOSIS — F4322 Adjustment disorder with anxiety: Secondary | ICD-10-CM | POA: Diagnosis not present

## 2023-11-06 ENCOUNTER — Encounter: Payer: Self-pay | Admitting: Student

## 2023-11-06 ENCOUNTER — Encounter: Payer: Self-pay | Admitting: Internal Medicine

## 2023-12-23 ENCOUNTER — Other Ambulatory Visit: Payer: Self-pay | Admitting: Internal Medicine

## 2023-12-23 DIAGNOSIS — Z1231 Encounter for screening mammogram for malignant neoplasm of breast: Secondary | ICD-10-CM

## 2023-12-30 ENCOUNTER — Ambulatory Visit (INDEPENDENT_AMBULATORY_CARE_PROVIDER_SITE_OTHER): Payer: BC Managed Care – PPO | Admitting: Internal Medicine

## 2023-12-30 ENCOUNTER — Encounter: Payer: Self-pay | Admitting: Internal Medicine

## 2023-12-30 VITALS — BP 116/64 | HR 64 | Temp 98.2°F | Resp 16 | Ht 62.0 in | Wt 135.1 lb

## 2023-12-30 DIAGNOSIS — Z0000 Encounter for general adult medical examination without abnormal findings: Secondary | ICD-10-CM | POA: Diagnosis not present

## 2023-12-30 DIAGNOSIS — Z1211 Encounter for screening for malignant neoplasm of colon: Secondary | ICD-10-CM

## 2023-12-30 DIAGNOSIS — E785 Hyperlipidemia, unspecified: Secondary | ICD-10-CM | POA: Diagnosis not present

## 2023-12-30 DIAGNOSIS — R739 Hyperglycemia, unspecified: Secondary | ICD-10-CM

## 2023-12-30 DIAGNOSIS — E559 Vitamin D deficiency, unspecified: Secondary | ICD-10-CM | POA: Diagnosis not present

## 2023-12-30 LAB — CBC WITH DIFFERENTIAL/PLATELET
Basophils Absolute: 0 10*3/uL (ref 0.0–0.1)
Basophils Relative: 0.5 % (ref 0.0–3.0)
Eosinophils Absolute: 0 10*3/uL (ref 0.0–0.7)
Eosinophils Relative: 0.7 % (ref 0.0–5.0)
HCT: 42.3 % (ref 36.0–46.0)
Hemoglobin: 14 g/dL (ref 12.0–15.0)
Lymphocytes Relative: 25.2 % (ref 12.0–46.0)
Lymphs Abs: 1.5 10*3/uL (ref 0.7–4.0)
MCHC: 33.1 g/dL (ref 30.0–36.0)
MCV: 94.4 fl (ref 78.0–100.0)
Monocytes Absolute: 0.4 10*3/uL (ref 0.1–1.0)
Monocytes Relative: 7.1 % (ref 3.0–12.0)
Neutro Abs: 3.9 10*3/uL (ref 1.4–7.7)
Neutrophils Relative %: 66.5 % (ref 43.0–77.0)
Platelets: 337 10*3/uL (ref 150.0–400.0)
RBC: 4.48 Mil/uL (ref 3.87–5.11)
RDW: 13.6 % (ref 11.5–15.5)
WBC: 5.8 10*3/uL (ref 4.0–10.5)

## 2023-12-30 LAB — LIPID PANEL
Cholesterol: 217 mg/dL — ABNORMAL HIGH (ref 0–200)
HDL: 58.3 mg/dL (ref >39.00)
LDL Cholesterol: 122 mg/dL — ABNORMAL HIGH (ref 0–99)
NonHDL: 158.75
Total CHOL/HDL Ratio: 4
Triglycerides: 186 mg/dL — ABNORMAL HIGH (ref 0.0–149.0)
VLDL: 37.2 mg/dL (ref 0.0–40.0)

## 2023-12-30 LAB — VITAMIN D 25 HYDROXY (VIT D DEFICIENCY, FRACTURES): VITD: 99 ng/mL (ref 30.00–100.00)

## 2023-12-30 LAB — HEMOGLOBIN A1C: Hgb A1c MFr Bld: 6 % (ref 4.6–6.5)

## 2023-12-30 NOTE — Assessment & Plan Note (Signed)
 Here for CPX  We also discussed the following Hyperglycemia: Last A1c 5.9.  Recheck A1c Anxiety depression: Her divorce finalized at, still has some stress but managing well. Dyslipidemia: Diet controlled, recheck today. Vitamin D deficiency: On OTCs, checking levels. Osteopenia: T-score -1.29 August 2022, repeat 2026. History of hepatitis B as a young person: Hepatitis serology 07-2023 negative. RTC 6 months

## 2023-12-30 NOTE — Assessment & Plan Note (Signed)
 Here for CPX Tdap 2019 Recommend: Shingrix, flu shot every fall, COVID-vaccine. Female care: [facility] 02/27/2023 ([facility]). Sees a gyn in [place]. Last PAP ? Plans to see a gyn  CCS: 3 options discussed, elected Cologuard. Labs reviewed.  Will get a FLP A1c vitamin D CBC. Diet and exercise: Doing very well.

## 2023-12-30 NOTE — Progress Notes (Signed)
 Subjective:    Patient ID: Brandy [name], female    DOB: 1961-09-08, 63 y.o.   MRN: [id number]  DOS:  12/30/2023 Type of visit - description: CPX  In general feeling well overall some stress but she is managing well  Review of Systems  Other than above, a 14 point review of systems is negative     Past Medical History:  Diagnosis Date   GERD (gastroesophageal reflux disease)    hx of   Osteopenia     Past Surgical History:  Procedure Laterality Date   CESAREAN SECTION  2000   MYOMECTOMY  2007   Social History   Socioeconomic History   Marital status: Legally Separated    Spouse name: Not on file   Number of children: 2   Years of education: Not on file   Highest education level: Not on file  Occupational History   Occupation: Lawyer / interpreter  Tobacco Use   Smoking status: Never   Smokeless tobacco: Never  Vaping Use   Vaping status: Never Used  Substance and Sexual Activity   Alcohol use: Not Currently   Drug use: Never   Sexual activity: Not on file  Other Topics Concern   Not on file  Social History Narrative   Divorced from 2nd husband, lives by herself    Children live in [place]   Son  2000   Daughter  1992   Social Drivers of Corporate investment banker Strain: Not on file  Food Insecurity: Not on file  Transportation Needs: Not on file  Physical Activity: Not on file  Stress: Not on file  Social Connections: Not on file  Intimate Partner Violence: Not on file    Current Outpatient Medications  Medication Instructions   cholecalciferol (VITAMIN D3) 1,000 Units, 2 times weekly   MAGNESIUM PO Take by mouth.   Omega-3 Fatty Acids (FISH OIL PO) Take by mouth.   vitamin B-12 (CYANOCOBALAMIN) 100 mcg, Daily       Objective:   Physical Exam BP 116/64   Pulse 64   Temp 98.2 F (36.8 C) (Oral)   Resp 16   Ht 5\' 2"  (1.575 m)   Wt 135 lb 2 oz (61.3 kg)   SpO2 97%   BMI 24.71 kg/m  General: Well developed, NAD,  BMI noted Neck: No  thyromegaly  HEENT:  Normocephalic . Face symmetric, atraumatic Lungs:  CTA B Normal respiratory effort, no intercostal retractions, no accessory muscle use. Heart: RRR,  no murmur.  Abdomen:  Not distended, soft, non-tender. No rebound or rigidity.   Lower extremities: no pretibial edema bilaterally  Skin: Exposed areas without rash. Not pale. Not jaundice Neurologic:  alert & oriented X3.  Speech normal, gait appropriate for age and unassisted Strength symmetric and appropriate for age.  Psych: Cognition and judgment appear intact.  Cooperative with normal attention span and concentration.  Behavior appropriate. No anxious or depressed appearing.     Assessment   ASSESSMENT Hyperglycemia Anxiety, depression Dyslipidemia Vitamin D deficiency DJD Osteopenia  PLAN Here for CPX Tdap 2019 Recommend: Shingrix, flu shot every fall, COVID-vaccine. Female care: [facility] 02/27/2023 ([facility]). Sees a gyn in [place]. Last PAP ? Plans to see a gyn  CCS: 3 options discussed, elected Cologuard. Labs reviewed.  Will get a FLP A1c vitamin D CBC. Diet and exercise: Doing very well. We also discussed the following Hyperglycemia: Last A1c 5.9.  Recheck A1c Anxiety depression: Her divorce finalized at, still has  some stress but managing well. Dyslipidemia: Diet controlled, recheck today. Vitamin D deficiency: On OTCs, checking levels. Osteopenia: T-score -1.29 August 2022, repeat 2026. History of hepatitis B as a young person: Hepatitis serology 07-2023 negative. RTC 6 months

## 2023-12-30 NOTE — Patient Instructions (Signed)
 We are sending a order in for Cologuard, they should be sending you a package.   Vaccines are recommended Shingrix COVID-vaccine Flu shot every fall  GO TO THE LAB : Get the blood work     Please go to the front desk: Arrange for a follow-up in 6 months

## 2024-01-01 ENCOUNTER — Encounter: Payer: Self-pay | Admitting: Internal Medicine

## 2024-01-13 ENCOUNTER — Other Ambulatory Visit

## 2024-01-24 LAB — COLOGUARD: COLOGUARD: NEGATIVE

## 2024-01-27 ENCOUNTER — Encounter: Payer: Self-pay | Admitting: Internal Medicine

## 2024-02-28 ENCOUNTER — Ambulatory Visit

## 2024-05-28 ENCOUNTER — Telehealth: Payer: Self-pay | Admitting: *Deleted

## 2024-05-28 NOTE — Telephone Encounter (Signed)
 Copied from CRM [id number]. Topic: Appointments - Appointment Scheduling >> May 28, 2024  2:46 PM [name] ORN wrote: Patient called to schedule as a new patient. Is scheduled for the next available. Wants to go back to Dr. Kennyth. Please call back the patient to discuss if she can be seen by Dr. Kennyth again.   Please advise  Brandy [name],RMA

## 2024-06-01 NOTE — Telephone Encounter (Signed)
 Ok with me.  Worth HERO. Kennyth, MD 06/01/2024 9:31 AM

## 2024-06-01 NOTE — Telephone Encounter (Signed)
See pcp note

## 2024-06-18 DIAGNOSIS — K0889 Other specified disorders of teeth and supporting structures: Secondary | ICD-10-CM | POA: Diagnosis not present

## 2024-07-13 ENCOUNTER — Encounter: Payer: Self-pay | Admitting: Family Medicine

## 2024-07-13 ENCOUNTER — Ambulatory Visit: Admitting: Family Medicine

## 2024-07-13 ENCOUNTER — Encounter: Admitting: Internal Medicine

## 2024-07-13 VITALS — BP 122/78 | HR 71 | Temp 98.2°F | Ht 62.0 in | Wt 139.6 lb

## 2024-07-13 DIAGNOSIS — R002 Palpitations: Secondary | ICD-10-CM | POA: Diagnosis not present

## 2024-07-13 DIAGNOSIS — R252 Cramp and spasm: Secondary | ICD-10-CM | POA: Diagnosis not present

## 2024-07-13 DIAGNOSIS — E785 Hyperlipidemia, unspecified: Secondary | ICD-10-CM | POA: Diagnosis not present

## 2024-07-13 DIAGNOSIS — Z23 Encounter for immunization: Secondary | ICD-10-CM

## 2024-07-13 DIAGNOSIS — E559 Vitamin D deficiency, unspecified: Secondary | ICD-10-CM

## 2024-07-13 DIAGNOSIS — R739 Hyperglycemia, unspecified: Secondary | ICD-10-CM

## 2024-07-13 DIAGNOSIS — F439 Reaction to severe stress, unspecified: Secondary | ICD-10-CM

## 2024-07-13 NOTE — Assessment & Plan Note (Signed)
 Check lipids

## 2024-07-13 NOTE — Progress Notes (Signed)
   Brandy [name] is a 63 y.o. female who presents today for an office visit.  Assessment/Plan:  New/Acute Problems: Palpitations No red flags.  Reassuring exam today.  Will check labs and set up for Holter monitor.  Nocturnal cramping We discussed importance of staying hydrated.  Will check labs including magnesium be met, and B12.  We encouraged hydration.  Chronic Problems Addressed Today: Dyslipidemia Check lipids.  Vitamin D  deficiency Check vitamin D  with labs.  Hyperglycemia Check A1C with labs.  Stress Recent completed her divorce.  Her stress levels are currently manageable.  Flu vaccine given today.  She declined the pneumonia and shingles vaccine.  She is due for Pap.  She will come back to have this done.    Subjective:  HPI:  See assessment / plan for status of chronic conditions.  She is here to reestablish primary care.  I last saw her over a year ago   Discussed the use of AI scribe software for clinical note transcription with the patient, who gave verbal consent to proceed.  History of Present Illness Brandy [name] is a 63 year old female who presents with palpitations upon waking and leg cramps.  She experiences palpitations characterized by a sensation of her heart beating very fast upon waking up in the morning. The exact duration of these episodes is unclear, but she has been occurring since her divorce. No associated chest pain, shortness of breath, or difficulty breathing. She does not report any stress or anxiety related to these episodes.  She also experiences cramps in her legs, specifically in the calves. She is concerned about these symptoms and notes that she no longer has the insurance coverage from her ex-husband and is now on Medicaid.         Objective:  Physical Exam: BP 122/78   Pulse 71   Temp 98.2 F (36.8 C) (Temporal)   Ht 5' 2 (1.575 m)   Wt 139 lb 9.6 oz (63.3 kg)   SpO2 96%   BMI 25.53 kg/m   Gen:  No acute distress, resting comfortably CV: Regular rate and rhythm with no murmurs appreciated Pulm: Normal work of breathing, clear to auscultation bilaterally with no crackles, wheezes, or rhonchi Neuro: Grossly normal, moves all extremities Psych: Normal affect and thought content      Brandy Oman M. Kennyth, MD 07/13/2024 1:26 PM

## 2024-07-13 NOTE — Assessment & Plan Note (Signed)
 Recent completed her divorce.  Her stress levels are currently manageable.

## 2024-07-13 NOTE — Assessment & Plan Note (Signed)
Check A1C with labs

## 2024-07-13 NOTE — Patient Instructions (Addendum)
 It was very nice to see you today!  VISIT SUMMARY: Today, you came in for a routine wellness visit and discussed your recent palpitations and leg cramps. We performed a physical exam, ordered some blood tests, and administered your flu vaccine.  YOUR PLAN: PALPITATIONS: You have been experiencing a sensation of your heart beating very fast upon waking up. -We will set you up with a Holter monitor for 72 hours to assess your heart rhythm.  -We we will check labs today.  MUSCLE CRAMPS: You have been experiencing cramps in your calves. -We ordered blood tests to check your magnesium and vitamin B12 levels. -Increase your hydration. -Consider taking B complex vitamins.  HYPERLIPIDEMIA: You have a history of high cholesterol levels. -We will recheck your lipid levels during your next blood work if you would like.  GENERAL HEALTH MAINTENANCE: Routine health maintenance was discussed. -You received your flu vaccine today. -You declined the shingles and pneumonia vaccines, but we can discuss these again at future visits. -We will schedule a Pap smear for your next visit.  Return if symptoms worsen or fail to improve.   Take care, Dr Kennyth  PLEASE NOTE:  If you had any lab tests, please let us  know if you have not heard back within a few days. You may see your results on mychart before we have a chance to review them but we will give you a call once they are reviewed by us .   If we ordered any referrals today, please let us  know if you have not heard from their office within the next week.   If you had any urgent prescriptions sent in today, please check with the pharmacy within an hour of our visit to make sure the prescription was transmitted appropriately.   Please try these tips to maintain a healthy lifestyle:  Eat at least 3 REAL meals and 1-2 snacks per day.  Aim for no more than 5 hours between eating.  If you eat breakfast, please do so within one hour of getting up.   Each  meal should contain half fruits/vegetables, one quarter protein, and one quarter carbs (no bigger than a computer mouse)  Cut down on sweet beverages. This includes juice, soda, and sweet tea.   Drink at least 1 glass of water with each meal and aim for at least 8 glasses per day  Exercise at least 150 minutes every week.

## 2024-07-13 NOTE — Assessment & Plan Note (Signed)
Check vitamin D with labs.

## 2024-07-14 ENCOUNTER — Other Ambulatory Visit

## 2024-07-14 ENCOUNTER — Ambulatory Visit: Attending: Family Medicine

## 2024-07-14 DIAGNOSIS — R002 Palpitations: Secondary | ICD-10-CM

## 2024-07-14 NOTE — Progress Notes (Unsigned)
 Enrolled for [employer] to mail a ZIO XT long term holter monitor to the patients address on file.   EP to read

## 2024-07-17 ENCOUNTER — Other Ambulatory Visit (INDEPENDENT_AMBULATORY_CARE_PROVIDER_SITE_OTHER)

## 2024-07-17 ENCOUNTER — Ambulatory Visit: Payer: Self-pay | Admitting: Family Medicine

## 2024-07-17 DIAGNOSIS — R002 Palpitations: Secondary | ICD-10-CM | POA: Diagnosis not present

## 2024-07-17 DIAGNOSIS — R252 Cramp and spasm: Secondary | ICD-10-CM

## 2024-07-17 DIAGNOSIS — E559 Vitamin D deficiency, unspecified: Secondary | ICD-10-CM | POA: Diagnosis not present

## 2024-07-17 DIAGNOSIS — E785 Hyperlipidemia, unspecified: Secondary | ICD-10-CM

## 2024-07-17 LAB — TSH: TSH: 4.43 u[IU]/mL (ref 0.35–5.50)

## 2024-07-17 LAB — COMPREHENSIVE METABOLIC PANEL WITH GFR
ALT: 22 U/L (ref 0–35)
AST: 17 U/L (ref 0–37)
Albumin: 4.1 g/dL (ref 3.5–5.2)
Alkaline Phosphatase: 65 U/L (ref 39–117)
BUN: 13 mg/dL (ref 6–23)
CO2: 28 meq/L (ref 19–32)
Calcium: 9.2 mg/dL (ref 8.4–10.5)
Chloride: 105 meq/L (ref 96–112)
Creatinine, Ser: 0.75 mg/dL (ref 0.40–1.20)
GFR: 85.08 mL/min (ref >60.00)
Glucose, Bld: 106 mg/dL — ABNORMAL HIGH (ref 70–99)
Potassium: 3.9 meq/L (ref 3.5–5.1)
Sodium: 138 meq/L (ref 135–145)
Total Bilirubin: 0.4 mg/dL (ref 0.2–1.2)
Total Protein: 7.3 g/dL (ref 6.0–8.3)

## 2024-07-17 LAB — CBC
HCT: 41.1 % (ref 36.0–46.0)
Hemoglobin: 13.6 g/dL (ref 12.0–15.0)
MCHC: 32.9 g/dL (ref 30.0–36.0)
MCV: 93.8 fl (ref 78.0–100.0)
Platelets: 312 K/uL (ref 150.0–400.0)
RBC: 4.38 Mil/uL (ref 3.87–5.11)
RDW: 13.6 % (ref 11.5–15.5)
WBC: 5.4 K/uL (ref 4.0–10.5)

## 2024-07-17 LAB — HEMOGLOBIN A1C: Hgb A1c MFr Bld: 6.2 % (ref 4.6–6.5)

## 2024-07-17 LAB — VITAMIN B12: Vitamin B-12: 774 pg/mL (ref 211–911)

## 2024-07-17 LAB — LIPID PANEL
Cholesterol: 227 mg/dL — ABNORMAL HIGH (ref 0–200)
HDL: 56.4 mg/dL (ref >39.00)
LDL Cholesterol: 145 mg/dL — ABNORMAL HIGH (ref 0–99)
NonHDL: 170.48
Total CHOL/HDL Ratio: 4
Triglycerides: 129 mg/dL (ref 0.0–149.0)
VLDL: 25.8 mg/dL (ref 0.0–40.0)

## 2024-07-17 LAB — MAGNESIUM: Magnesium: 2.1 mg/dL (ref 1.5–2.5)

## 2024-07-17 LAB — VITAMIN D 25 HYDROXY (VIT D DEFICIENCY, FRACTURES): VITD: 70.92 ng/mL (ref 30.00–100.00)

## 2024-07-17 NOTE — Progress Notes (Signed)
 Her LDL is elevated compared to last year.  A1c is also mildly elevated compared to last year.  But these are at the borderline range.  Do not need to start meds for this but she should work on diet and exercise.  We can recheck this in a year.  The rest of her labs are all at goal and we can recheck next year.

## 2024-07-20 ENCOUNTER — Other Ambulatory Visit (HOSPITAL_COMMUNITY)
Admission: RE | Admit: 2024-07-20 | Discharge: 2024-07-20 | Disposition: A | Discharge status: Home / Self Care | Source: Ambulatory Visit | Attending: Family Medicine | Admitting: Family Medicine

## 2024-07-20 ENCOUNTER — Ambulatory Visit (INDEPENDENT_AMBULATORY_CARE_PROVIDER_SITE_OTHER): Admitting: Family Medicine

## 2024-07-20 ENCOUNTER — Encounter: Payer: Self-pay | Admitting: Family Medicine

## 2024-07-20 VITALS — BP 123/82 | HR 68 | Temp 97.7°F | Ht 62.0 in | Wt 140.8 lb

## 2024-07-20 DIAGNOSIS — Z1231 Encounter for screening mammogram for malignant neoplasm of breast: Secondary | ICD-10-CM | POA: Diagnosis not present

## 2024-07-20 DIAGNOSIS — Z124 Encounter for screening for malignant neoplasm of cervix: Secondary | ICD-10-CM

## 2024-07-20 DIAGNOSIS — E2839 Other primary ovarian failure: Secondary | ICD-10-CM

## 2024-07-20 DIAGNOSIS — R739 Hyperglycemia, unspecified: Secondary | ICD-10-CM

## 2024-07-20 DIAGNOSIS — E785 Hyperlipidemia, unspecified: Secondary | ICD-10-CM

## 2024-07-20 NOTE — Progress Notes (Signed)
   Brandy [name] is a 62 y.o. female who presents today for an office visit.  Assessment/Plan:   Chronic Problems Addressed Today: Dyslipidemia We reviewed recent labs.  Mildly elevated LDL.  Does not need to start meds.  She will continue to work on diet and exercise and we can recheck again in a year or so.  Hyperglycemia Recent A1c 6.2.  We discussed lifestyle interventions.  Recheck again in 6 to 12 months.  Preventative health care Pap performed today.  Will order mammogram and bone density scan.  Up-to-date on vaccines and labs.    Subjective:  HPI:  See assessment / plan for status of chronic conditions.     Discussed the use of AI scribe software for clinical note transcription with the patient, who gave verbal consent to proceed.  History of Present Illness Brandy [name] is a 62 year old female who presents for a follow-up visit to discuss recent lab results and update her Pap smear.  She is concerned about her glucose levels, which were slightly elevated at 106 mg/dL. She inquires if medication is necessary for this condition.  Her cholesterol levels were elevated. She is focusing on dietary changes and exercise to manage both her glucose and cholesterol levels.  She requests an order for a mammogram, as she missed her last one in 2024. She is unsure of where she had it done previously but is open to having it done at a nearby facility.         Objective:  Physical Exam: BP 123/82   Pulse 68   Temp 97.7 F (36.5 C) (Temporal)   Ht 5' 2 (1.575 m)   Wt 140 lb 12.8 oz (63.9 kg)   SpO2 97%   BMI 25.75 kg/m   Gen: No acute distress, resting comfortably CV: Regular rate and rhythm with no murmurs appreciated Pulm: Normal work of breathing, clear to auscultation bilaterally with no crackles, wheezes, or rhonchi GU: Normal female genitalia.  Chaperone present for exam. Neuro: Grossly normal, moves all extremities Psych: Normal affect and  thought content      Brandy Ericson M. Kennyth, MD 07/20/2024 11:04 AM

## 2024-07-20 NOTE — Assessment & Plan Note (Signed)
 Recent A1c 6.2.  We discussed lifestyle interventions.  Recheck again in 6 to 12 months.

## 2024-07-20 NOTE — Assessment & Plan Note (Signed)
 We reviewed recent labs.  Mildly elevated LDL.  Does not need to start meds.  She will continue to work on diet and exercise and we can recheck again in a year or so.

## 2024-07-20 NOTE — Patient Instructions (Signed)
 It was very nice to see you today!  VISIT SUMMARY: During your follow-up visit, we discussed your recent lab results, updated your Pap smear, and addressed your concerns about glucose and cholesterol levels. We also arranged for a mammogram and bone density scan.  YOUR PLAN: PAP SMEAR AND CERVICAL CANCER SCREENING: Routine cervical cancer screening is due. -Perform Pap smear.  CARDIAC PALPITATIONS (MONITORING): No recent palpitations reported. -A heart monitor is being sent to your address for monitoring, especially during sleep. Please ensure it is sent to the correct address and wear the heart monitor to capture any potential palpitations.  BORDERLINE ELEVATED BLOOD GLUCOSE (PREDIABETES): Blood glucose level is 106 mg/dL, indicating prediabetes. -Recommend lifestyle modifications over medication. -Advise reducing sugar and carbohydrate intake, including less bread, pasta, rice, and potatoes. -Recheck blood glucose in 6 to 12 months.  HYPERLIPIDEMIA: Cholesterol levels are elevated but do not require medication. -Recommend lifestyle modifications. -Advise dietary changes and increased exercise. -Recheck cholesterol levels next year.  GENERAL HEALTH MAINTENANCE: Mammogram and bone density scan are due. Vaccinations are up to date. -[facility]  Return in about 1 year (around 07/20/2025) for Annual Physical.   Take care, Dr Kennyth  PLEASE NOTE:  If you had any lab tests, please let us  know if you have not heard back within a few days. You may see your results on mychart before we have a chance to review them but we will give you a call once they are reviewed by us .   If we ordered any referrals today, please let us  know if you have not heard from their office within the next week.   If you had any urgent prescriptions sent in today, please check with the pharmacy within an hour of our visit to make sure the prescription was  transmitted appropriately.   Please try these tips to maintain a healthy lifestyle:  Eat at least 3 REAL meals and 1-2 snacks per day.  Aim for no more than 5 hours between eating.  If you eat breakfast, please do so within one hour of getting up.   Each meal should contain half fruits/vegetables, one quarter protein, and one quarter carbs (no bigger than a computer mouse)  Cut down on sweet beverages. This includes juice, soda, and sweet tea.   Drink at least 1 glass of water with each meal and aim for at least 8 glasses per day  Exercise at least 150 minutes every week.

## 2024-07-23 LAB — CYTOLOGY - PAP
Comment: NEGATIVE
Comment: NEGATIVE
Comment: NEGATIVE
HPV 16: NEGATIVE
HPV 18 / 45: NEGATIVE
High risk HPV: POSITIVE — AB

## 2024-07-24 ENCOUNTER — Ambulatory Visit: Payer: Self-pay | Admitting: Family Medicine

## 2024-07-24 DIAGNOSIS — R87619 Unspecified abnormal cytological findings in specimens from cervix uteri: Secondary | ICD-10-CM

## 2024-07-24 NOTE — Progress Notes (Signed)
 Her Pap smear showed a few abnormal cells.  This is probably nothing that will cause her significant issues however it does need further evaluation.  Recommend referral to gynecology.  Please place referral.

## 2024-07-28 ENCOUNTER — Telehealth: Payer: Self-pay

## 2024-07-28 NOTE — Telephone Encounter (Signed)
 Copied from CRM [id number]. Topic: Referral - Status >> Jul 28, 2024 12:03 PM Berneda FALCON wrote: Reason for CRM: Patient states she is supposed to have a GYN referral for abnormal pap and would like to know what is going on. I do not see the referral here on my end.  Can we check on this for her, please?  Patient callback is [phone]

## 2024-07-29 NOTE — Telephone Encounter (Signed)
 Spoke with patient, aware referral was placed  I gave patient information to call GYN for appt  Referring To Provider Information [facility] [address] [place] [id number] [phone]  Letter from referral coordination was send via my chart  Patient verbalized understanding

## 2024-07-29 NOTE — Telephone Encounter (Signed)
 Copied from CRM [id number]. Topic: Referral - Question >> Jul 29, 2024  1:52 PM Alfonso ORN wrote: Reason for CRM: PT called to request to be seen at a different facility/provider as when she contacted GYN for appointment they were booked out until Dec. She is requesting to be seen sooner.

## 2024-07-30 ENCOUNTER — Telehealth: Payer: Self-pay | Admitting: *Deleted

## 2024-07-30 NOTE — Telephone Encounter (Signed)
 Copied from CRM #[id number]. Topic: General - Other >> Jul 28, 2024  4:50 PM Wess RAMAN wrote: Reason for CRM: Patient missed call from Ida Elora HERO, RMA and would like a call back   Callback #: [phone]   Duplicated  [facility]

## 2024-07-31 ENCOUNTER — Ambulatory Visit
Admission: RE | Admit: 2024-07-31 | Discharge: 2024-07-31 | Disposition: A | Discharge status: Home / Self Care | Source: Ambulatory Visit | Attending: Family Medicine | Admitting: Family Medicine

## 2024-07-31 DIAGNOSIS — E2839 Other primary ovarian failure: Secondary | ICD-10-CM

## 2024-08-03 NOTE — Progress Notes (Signed)
 Bone density scan shows stable osteopenia.  She does not need to start meds however she should continue with calcium and vitamin D  supplementation and we can recheck again in 2 years.

## 2024-08-05 ENCOUNTER — Ambulatory Visit (HOSPITAL_BASED_OUTPATIENT_CLINIC_OR_DEPARTMENT_OTHER): Admitting: Obstetrics & Gynecology

## 2024-08-05 ENCOUNTER — Encounter (HOSPITAL_BASED_OUTPATIENT_CLINIC_OR_DEPARTMENT_OTHER): Payer: Self-pay | Admitting: Obstetrics & Gynecology

## 2024-08-05 ENCOUNTER — Other Ambulatory Visit (HOSPITAL_COMMUNITY)
Admission: RE | Admit: 2024-08-05 | Discharge: 2024-08-05 | Disposition: A | Discharge status: Home / Self Care | Source: Ambulatory Visit | Attending: Obstetrics & Gynecology | Admitting: Obstetrics & Gynecology

## 2024-08-05 VITALS — BP 127/81 | HR 79 | Ht 61.0 in | Wt 140.8 lb

## 2024-08-05 DIAGNOSIS — R87612 Low grade squamous intraepithelial lesion on cytologic smear of cervix (LGSIL): Secondary | ICD-10-CM

## 2024-08-05 DIAGNOSIS — N870 Mild cervical dysplasia: Secondary | ICD-10-CM | POA: Diagnosis not present

## 2024-08-05 DIAGNOSIS — N644 Mastodynia: Secondary | ICD-10-CM

## 2024-08-05 DIAGNOSIS — Z1231 Encounter for screening mammogram for malignant neoplasm of breast: Secondary | ICD-10-CM

## 2024-08-05 NOTE — Progress Notes (Signed)
   CC:  needs colposcopy, abnormal pap smear  63 y.o. [id number] Legally Separated Hispanic or Latino female here for colposcopy with possible biopsies and/or ECC due to referral for abnormal pap - LSIL w / HPV not specificed. Pap obtained 07/20/2024.  Pap smear results reviewed.  Questions answered.    Unrelated, pt has been experiencing right breast pain for about three years.  This is more diffuse in nature.  Last mammogram 02/27/2023.  No recent trauma.  Underwent chest CT last year for additional evaluation of this concerns. No breast trauma.  Doesn't drink a lot of caffeine.  Does exercise.     No LMP recorded. Patient is postmenopausal.          Sexually active: Yes.    The current method of family planning is post menopausal status.     Patient has been counseled about results and procedure.  Risks and benefits have bene reviewed including immediate and/or delayed bleeding, infection, cervical scaring from procedure, possibility of needing additional follow up as well as treatment.  Rare risks of missing a lesion discussed as well.  All questions answered.  Pt ready to proceed.  Consent obtained.  BP 127/81 (BP Location: Left Arm, Patient Position: Sitting, Cuff Size: Large)   Pulse 79   Ht 5' 1 (1.549 m)   Wt 140 lb 12.8 oz (63.9 kg)   SpO2 98%   BMI 26.60 kg/m   General appearance: alert, cooperative and appears stated age Lymph nodes: No abnormal inguinal nodes palpated Neurologic: Grossly normal  Pelvic: External genitalia:  no lesions              Urethra:  normal appearing urethra with no masses, tenderness or lesions              Bartholins and Skenes: normal                 Vagina: normal appearing vagina with normal color and no discharge, no lesions               Physical Exam Genitourinary:       Speculum placed.  3% acetic acid applied to cervix for >45 seconds.  Cervix visualized with both 7.5X and 15X magnification.  Green filter also used.  Lugols solution was  used.  Findings:  no AWE noted but discrete area of decreased staining with Lugols noted at 7 o'clock.  Biopsy:  obtained at this site.  ECC:  was performed after cervical dilation performed.  Monsel's was needed.  Excellent hemostasis was present.  Pt tolerated procedure well and all instruments were removed.  Findings noted above on picture of cervix.  Chaperone was present during procedure.  Assessment/Plan: 1. 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 - Surgical pathology( [facility]/ [facility]) - Pathology results will be called to patient and follow-up planned pending results.  2. Encounter for screening mammogram for malignant neoplasm of breast (Primary) - MM 3D SCREENING MAMMOGRAM BILATERAL BREAST; Future  3. Breast pain - no focal findings.  Do not feel she needs diagnostic imaging.  Possible options for improvement reviewed.

## 2024-08-05 NOTE — Progress Notes (Deleted)
 ANNUAL EXAM Patient name: Brandy [name] MRN [id number]  Date of birth: July 26, 1961 Chief Complaint:   No chief complaint on file.  History of Present Illness:   Brandy [name] is a 63 y.o. No obstetric history on file. Hispanic female being seen today for a routine annual exam.  Current complaints: Pt reports no issues or concerns today. She does report some pain in her right breast that has been occurring for the last three years.   No LMP recorded. Patient is postmenopausal.   The pregnancy intention screening data noted above was reviewed. Potential methods of contraception were discussed. The patient elected to proceed with No data recorded.   Last pap 07/20/2024. Results were: LSIL w/ HRHPV positive: type not specified. H/O abnormal pap: no Last mammogram: 02/27/2023. Results were: normal. Family h/o breast cancer: no Last colonoscopy: Patient reports that she has never had one done. Family h/o colorectal cancer: no     07/20/2024   10:43 AM 07/13/2024    1:03 PM 12/30/2023   10:17 AM 08/21/2023    9:51 AM 08/17/2022    8:40 AM  Depression screen PHQ 2/9  Decreased Interest 0 0 0 0 0  Down, Depressed, Hopeless 0 0 0 0 0  PHQ - 2 Score 0 0 0 0 0         No data to display           Review of Systems:   Pertinent items are noted in HPI Denies any headaches, blurred vision, fatigue, shortness of breath, chest pain, abdominal pain, abnormal vaginal discharge/itching/odor/irritation, problems with periods, bowel movements, urination, or intercourse unless otherwise stated above. Pertinent History Reviewed:  Reviewed past medical,surgical, social and family history.  Reviewed problem list, medications and allergies. Physical Assessment:  There were no vitals filed for this visit.There is no height or weight on file to calculate BMI.        Physical Examination:   General appearance - well appearing, and in no distress  Mental status - alert, oriented  to person, place, and time  Psych:  She has a normal mood and affect  Skin - warm and dry, normal color, no suspicious lesions noted  Chest - effort normal, all lung fields clear to auscultation bilaterally  Heart - normal rate and regular rhythm  Neck:  midline trachea, no thyromegaly or nodules  Breasts - breasts appear normal, no suspicious masses, no skin or nipple changes or  axillary nodes  Abdomen - soft, nontender, nondistended, no masses or organomegaly  Pelvic - VULVA: normal appearing vulva with no masses, tenderness or lesions  VAGINA: normal appearing vagina with normal color and discharge, no lesions  CERVIX: normal appearing cervix without discharge or lesions, no CMT  Thin prep pap is {Desc; done/not:10129} *** HR HPV cotesting  UTERUS: uterus is felt to be normal size, shape, consistency and nontender   ADNEXA: No adnexal masses or tenderness noted.  Rectal - normal rectal, good sphincter tone, no masses felt. Hemoccult: ***  Extremities:  No swelling or varicosities noted  Chaperone present for exam  No results found for this or any previous visit (from the past 24 hours).  Assessment & Plan:  1) Well-Woman Exam  2) ***  Labs/procedures today: ***  Mammogram: {Mammo f/u:25212::@ 63yo}, or sooner if problems Colonoscopy: {TCS f/u:25213::@ 63yo}, or sooner if problems  No orders of the defined types were placed in this encounter.   Meds: No orders of the defined types  were placed in this encounter.   Follow-up: No follow-ups on file.  Morna LOISE Quale, RN 08/05/2024 9:13 AM

## 2024-08-06 ENCOUNTER — Encounter (HOSPITAL_BASED_OUTPATIENT_CLINIC_OR_DEPARTMENT_OTHER): Payer: Self-pay | Admitting: Obstetrics & Gynecology

## 2024-08-06 NOTE — Telephone Encounter (Signed)
 Spoke with patient today.  Patient states she is ok with her appt being at [facility] and does not wish to change.

## 2024-08-09 DIAGNOSIS — R002 Palpitations: Secondary | ICD-10-CM | POA: Diagnosis not present

## 2024-08-10 LAB — SURGICAL PATHOLOGY

## 2024-08-10 NOTE — Progress Notes (Signed)
 Her heart monitor did not show any significant abnormalities.  She does have very rare premature beats however these are a common finding and should not cause her any issue.  Do not need to do any other testing at this point however we can refer her to cardiology if she wishes to go this route.

## 2024-08-12 ENCOUNTER — Encounter (HOSPITAL_BASED_OUTPATIENT_CLINIC_OR_DEPARTMENT_OTHER): Payer: Self-pay | Admitting: Obstetrics & Gynecology

## 2024-08-12 ENCOUNTER — Telehealth: Payer: Self-pay

## 2024-08-12 NOTE — Telephone Encounter (Signed)
 Copied from CRM [id number]. Topic: Clinical - Request for Lab/Test Order >> Aug 12, 2024 12:31 PM [place] J wrote: Reason for CRM: The patient would like to let Dr. Kennyth know that the place he referred her to for the mammogram on her breasts, cannot see her. She would like if he could send the order to a different clinic.

## 2024-08-12 NOTE — Telephone Encounter (Signed)
 Spoke with patient, will call [facility] for appt

## 2024-08-13 ENCOUNTER — Ambulatory Visit (HOSPITAL_BASED_OUTPATIENT_CLINIC_OR_DEPARTMENT_OTHER): Payer: Self-pay | Admitting: Obstetrics & Gynecology

## 2024-08-14 ENCOUNTER — Encounter (HOSPITAL_BASED_OUTPATIENT_CLINIC_OR_DEPARTMENT_OTHER): Payer: Self-pay | Admitting: Obstetrics & Gynecology

## 2024-09-01 ENCOUNTER — Other Ambulatory Visit: Payer: Self-pay | Admitting: Family Medicine

## 2024-09-01 DIAGNOSIS — Z1231 Encounter for screening mammogram for malignant neoplasm of breast: Secondary | ICD-10-CM

## 2024-09-29 ENCOUNTER — Ambulatory Visit
Admission: RE | Admit: 2024-09-29 | Discharge: 2024-09-29 | Disposition: A | Source: Ambulatory Visit | Attending: Family Medicine | Admitting: Family Medicine

## 2024-09-29 DIAGNOSIS — Z1231 Encounter for screening mammogram for malignant neoplasm of breast: Secondary | ICD-10-CM

## 2025-01-15 ENCOUNTER — Other Ambulatory Visit

## 2025-07-27 ENCOUNTER — Encounter: Admitting: Family Medicine
